# Patient Record
Sex: Female | Born: 1955 | Race: White | Hispanic: No | Marital: Married
Health system: Southern US, Community
[De-identification: ages and names within clinical notes are randomized; demographics above are authoritative.]

## PROBLEM LIST (undated history)

## (undated) DIAGNOSIS — E119 Type 2 diabetes mellitus without complications: Secondary | ICD-10-CM

## (undated) DIAGNOSIS — E079 Disorder of thyroid, unspecified: Secondary | ICD-10-CM

## (undated) HISTORY — PX: TOTAL ABDOMINAL HYSTERECTOMY: SHX209

## (undated) HISTORY — PX: KNEE ARTHROSCOPY: SUR90

## (undated) HISTORY — PX: CHOLECYSTECTOMY: SHX55

## (undated) HISTORY — PX: ABDOMINAL HYSTERECTOMY: SHX81

---

## 2013-12-27 ENCOUNTER — Other Ambulatory Visit: Payer: Self-pay | Admitting: Gastroenterology

## 2013-12-27 DIAGNOSIS — R945 Abnormal results of liver function studies: Principal | ICD-10-CM

## 2013-12-27 DIAGNOSIS — R7989 Other specified abnormal findings of blood chemistry: Secondary | ICD-10-CM

## 2014-01-03 ENCOUNTER — Other Ambulatory Visit: Payer: Self-pay

## 2014-04-08 ENCOUNTER — Other Ambulatory Visit: Payer: Self-pay | Admitting: Gastroenterology

## 2014-04-08 DIAGNOSIS — K7581 Nonalcoholic steatohepatitis (NASH): Secondary | ICD-10-CM

## 2014-04-17 ENCOUNTER — Other Ambulatory Visit: Payer: Self-pay

## 2014-04-18 ENCOUNTER — Ambulatory Visit
Admission: RE | Admit: 2014-04-18 | Discharge: 2014-04-18 | Disposition: A | Discharge status: Home / Self Care | Payer: BC Managed Care – PPO | Source: Ambulatory Visit | Attending: Gastroenterology | Admitting: Gastroenterology

## 2014-04-18 DIAGNOSIS — K7581 Nonalcoholic steatohepatitis (NASH): Secondary | ICD-10-CM

## 2014-04-18 MED ORDER — IOHEXOL 300 MG/ML  SOLN
125.0000 mL | Freq: Once | INTRAMUSCULAR | Status: AC | PRN
  Administered 2014-04-18: 125 mL via INTRAVENOUS

## 2014-06-15 DIAGNOSIS — R7301 Impaired fasting glucose: Secondary | ICD-10-CM | POA: Insufficient documentation

## 2014-06-15 DIAGNOSIS — N921 Excessive and frequent menstruation with irregular cycle: Secondary | ICD-10-CM | POA: Insufficient documentation

## 2014-06-15 DIAGNOSIS — Z9889 Other specified postprocedural states: Secondary | ICD-10-CM | POA: Insufficient documentation

## 2014-06-15 DIAGNOSIS — R7401 Elevation of levels of liver transaminase levels: Secondary | ICD-10-CM | POA: Insufficient documentation

## 2014-06-15 DIAGNOSIS — R7402 Elevation of levels of lactic acid dehydrogenase (LDH): Secondary | ICD-10-CM | POA: Insufficient documentation

## 2014-06-15 DIAGNOSIS — F909 Attention-deficit hyperactivity disorder, unspecified type: Secondary | ICD-10-CM | POA: Insufficient documentation

## 2014-06-15 DIAGNOSIS — IMO0002 Reserved for concepts with insufficient information to code with codable children: Secondary | ICD-10-CM | POA: Insufficient documentation

## 2014-06-15 DIAGNOSIS — L409 Psoriasis, unspecified: Secondary | ICD-10-CM | POA: Insufficient documentation

## 2014-06-15 DIAGNOSIS — Z87410 Personal history of cervical dysplasia: Secondary | ICD-10-CM | POA: Insufficient documentation

## 2015-09-04 DIAGNOSIS — Z90710 Acquired absence of both cervix and uterus: Secondary | ICD-10-CM | POA: Insufficient documentation

## 2016-06-29 DIAGNOSIS — M1712 Unilateral primary osteoarthritis, left knee: Secondary | ICD-10-CM | POA: Insufficient documentation

## 2019-01-01 ENCOUNTER — Telehealth: Payer: Self-pay

## 2019-01-01 NOTE — Telephone Encounter (Signed)
Pt wanted provider to be aware she dropped off her FMLA form on Thursday 12/28/18. Pt requests a call once form is completed. No other inquiries during call.

## 2019-01-01 NOTE — Telephone Encounter (Signed)
Ok will work on this once back in the office tomorrow.

## 2019-05-06 ENCOUNTER — Other Ambulatory Visit: Payer: Self-pay

## 2019-05-06 ENCOUNTER — Emergency Department (HOSPITAL_BASED_OUTPATIENT_CLINIC_OR_DEPARTMENT_OTHER): Payer: BC Managed Care – PPO

## 2019-05-06 ENCOUNTER — Emergency Department (HOSPITAL_BASED_OUTPATIENT_CLINIC_OR_DEPARTMENT_OTHER)
Admission: EM | Admit: 2019-05-06 | Discharge: 2019-05-06 | Disposition: A | Discharge status: Home / Self Care | Payer: BC Managed Care – PPO | Attending: Emergency Medicine | Admitting: Emergency Medicine

## 2019-05-06 ENCOUNTER — Encounter (HOSPITAL_BASED_OUTPATIENT_CLINIC_OR_DEPARTMENT_OTHER): Payer: Self-pay | Admitting: Emergency Medicine

## 2019-05-06 DIAGNOSIS — S42201A Unspecified fracture of upper end of right humerus, initial encounter for closed fracture: Secondary | ICD-10-CM

## 2019-05-06 DIAGNOSIS — Y9389 Activity, other specified: Secondary | ICD-10-CM | POA: Diagnosis not present

## 2019-05-06 DIAGNOSIS — S161XXA Strain of muscle, fascia and tendon at neck level, initial encounter: Secondary | ICD-10-CM | POA: Insufficient documentation

## 2019-05-06 DIAGNOSIS — S0990XA Unspecified injury of head, initial encounter: Secondary | ICD-10-CM

## 2019-05-06 DIAGNOSIS — S2241XA Multiple fractures of ribs, right side, initial encounter for closed fracture: Secondary | ICD-10-CM | POA: Diagnosis not present

## 2019-05-06 DIAGNOSIS — Y9289 Other specified places as the place of occurrence of the external cause: Secondary | ICD-10-CM | POA: Insufficient documentation

## 2019-05-06 DIAGNOSIS — W19XXXA Unspecified fall, initial encounter: Secondary | ICD-10-CM

## 2019-05-06 DIAGNOSIS — Y998 Other external cause status: Secondary | ICD-10-CM | POA: Insufficient documentation

## 2019-05-06 DIAGNOSIS — W11XXXA Fall on and from ladder, initial encounter: Secondary | ICD-10-CM | POA: Diagnosis not present

## 2019-05-06 DIAGNOSIS — S42401A Unspecified fracture of lower end of right humerus, initial encounter for closed fracture: Secondary | ICD-10-CM | POA: Insufficient documentation

## 2019-05-06 HISTORY — DX: Type 2 diabetes mellitus without complications: E11.9

## 2019-05-06 HISTORY — DX: Disorder of thyroid, unspecified: E07.9

## 2019-05-06 MED ORDER — OXYCODONE-ACETAMINOPHEN 5-325 MG PO TABS: 2.0000 | ORAL_TABLET | Freq: Four times a day (QID) | ORAL | 0 refills | Status: DC | PRN

## 2019-05-06 MED ORDER — MORPHINE SULFATE (PF) 4 MG/ML IV SOLN
4.0000 mg | Freq: Once | INTRAVENOUS | Status: AC
  Administered 2019-05-06: 4 mg via INTRAVENOUS
  Filled 2019-05-06: qty 1

## 2019-05-06 MED ORDER — ONDANSETRON HCL 4 MG/2ML IJ SOLN
4.0000 mg | Freq: Once | INTRAMUSCULAR | Status: AC
  Administered 2019-05-06: 18:00:00 4 mg via INTRAVENOUS
  Filled 2019-05-06: qty 2

## 2019-05-06 MED ORDER — OXYCODONE-ACETAMINOPHEN 5-325 MG PO TABS: 1.0000 | ORAL_TABLET | Freq: Four times a day (QID) | ORAL | 0 refills | Status: DC | PRN

## 2019-05-06 NOTE — ED Provider Notes (Signed)
Emergency Department Provider Note   I have reviewed the triage vital signs and the nursing notes.   HISTORY  Chief Complaint Fall and Shoulder Pain   HPI Roberta Hayes is a 63 y.o. female presents to the ED for evaluation of right shoulder and neck pain after falling backwards off a ladder. Patient was getting X-mas decorations down and fell. Question head injury but denies LOC. No numbness/weakness. Mild right elbow pain and severe pain in the right shoulder. No LE pain. No radiation of symptoms.   Past Medical History:  Diagnosis Date   Diabetes mellitus without complication ([place])    Thyroid disease     There are no active problems to display for this patient.   Allergies Patient has no allergy information on record.  No family history on file.  Social History Social History   Tobacco Use   Smoking status: Never Smoker   Smokeless tobacco: Never Used  Substance Use Topics   Alcohol use: Yes    Comment: rarely   Drug use: Never    Review of Systems  Constitutional: No fever/chills Eyes: No visual changes. ENT: No sore throat. Cardiovascular: Denies chest pain. Respiratory: Denies shortness of breath. Gastrointestinal: No abdominal pain.  No nausea, no vomiting.  No diarrhea.  No constipation. Genitourinary: Negative for dysuria. Musculoskeletal: Negative for back pain. Positive right shoulder and neck pain.  Skin: Negative for rash. Neurological: Negative for focal weakness or numbness. Positive HA.   10-point ROS otherwise negative.  ____________________________________________   PHYSICAL EXAM:  VITAL SIGNS: ED Triage Vitals  Enc Vitals Group     BP 05/06/19 1656 125/79     Pulse Rate 05/06/19 1656 91     Resp 05/06/19 1656 18     Temp 05/06/19 1656 98.2 F (36.8 C)     Temp Source 05/06/19 1656 Oral     SpO2 05/06/19 1656 100 %     Weight 05/06/19 1656 210 lb (95.3 kg)     Height 05/06/19 1656 5\' 5"  (1.651 m)   Constitutional:  Alert and oriented. Well appearing and in no acute distress. Eyes: Conjunctivae are normal. PERRL. Head: Atraumatic. Nose: No congestion/rhinnorhea. Mouth/Throat: Mucous membranes are moist.  Neck: No stridor. No cervical spine tenderness to palpation. Cardiovascular: Normal rate, regular rhythm. Good peripheral circulation. Grossly normal heart sounds.   Respiratory: Normal respiratory effort.  No retractions. Lungs CTAB. Gastrointestinal: Soft and nontender. No distention.  Musculoskeletal: No lower extremity tenderness nor edema. Severe pain with any movement of the right shoulder. Mild tenderness to the lateral elbow. No tenderness in the wrists.  Neurologic:  Normal speech and language. No gross focal neurologic deficits are appreciated.  Skin:  Skin is warm, dry and intact. No rash noted.  ____________________________________________  RADIOLOGY  Dg Ribs Unilateral W/chest Right  Result Date: 05/06/2019 CLINICAL DATA:  Patient status post fall from a ladder. EXAM: RIGHT RIBS AND CHEST - 3+ VIEW COMPARISON:  None. FINDINGS: Normal cardiac and mediastinal contours. No consolidative pulmonary opacities. No pleural effusion or pneumothorax. Impacted proximal right humerus fracture. Possible nondisplaced right lateral fourth rib fracture. Additionally, there is a nondisplaced lateral right seventh rib fracture. IMPRESSION: Nondisplaced right lateral seventh rib fracture and possible lateral right fourth rib fracture. Clear lungs. Proximal right humerus fracture. Electronically Signed   By: Lovey Newcomer M.D.   On: 05/06/2019 18:35   Dg Shoulder Right  Result Date: 05/06/2019 CLINICAL DATA:  Fall EXAM: RIGHT SHOULDER - 2+ VIEW COMPARISON:  None. FINDINGS:  AC joint is intact. Acute mildly impacted fracture involving the right humeral neck with extension of fracture lucency to the greater tuberosity of the humerus. No humeral head dislocation IMPRESSION: Acute mildly comminuted and impacted  fracture involving the right humeral neck with fracture lucency also visible at the greater tuberosity Electronically Signed   By: Jasmine Pang M.D.   On: 05/06/2019 18:33   Dg Elbow 2 Views Right  Result Date: 05/06/2019 CLINICAL DATA:  Fall EXAM: RIGHT ELBOW - 2 VIEW COMPARISON:  None. FINDINGS: Suboptimal lateral view due to positioning and overlying soft tissues. Limited evaluation for elbow effusion. No gross fracture or dislocation IMPRESSION: Limited evaluation for elbow effusion for reasons above. No definite acute osseous abnormality Electronically Signed   By: Jasmine Pang M.D.   On: 05/06/2019 18:33   Ct Head Wo Contrast  Result Date: 05/06/2019 CLINICAL DATA:  Fall EXAM: CT HEAD WITHOUT CONTRAST CT CERVICAL SPINE WITHOUT CONTRAST TECHNIQUE: Multidetector CT imaging of the head and cervical spine was performed following the standard protocol without intravenous contrast. Multiplanar CT image reconstructions of the cervical spine were also generated. COMPARISON:  None. FINDINGS: CT HEAD FINDINGS Brain: No acute territorial infarction, hemorrhage, or intracranial mass. The ventricles are nonenlarged. Vascular: No hyperdense vessels.  Carotid vascular calcification Skull: Normal. Negative for fracture or focal lesion. Sinuses/Orbits: No acute finding. Other: None CT CERVICAL SPINE FINDINGS Alignment: Straightening of the cervical spine. No subluxation. Facet alignment within normal limits Skull base and vertebrae: No acute fracture. No primary bone lesion or focal pathologic process. Soft tissues and spinal canal: No prevertebral fluid or swelling. No visible canal hematoma. Disc levels: Mild disc space narrowing at C4-C5, moderate-to-marked disc space narrowing at C5-C6 and C6-C7. Bilateral foraminal narrowing at C5-C6 and C6-C7. Upper chest: Negative. Other: None IMPRESSION: 1. Negative non contrasted CT appearance of the brain 2. Straightening of the cervical spine with degenerative change. No  acute osseous abnormality Electronically Signed   By: Jasmine Pang M.D.   On: 05/06/2019 18:49   Ct Cervical Spine Wo Contrast  Result Date: 05/06/2019 CLINICAL DATA:  Fall EXAM: CT HEAD WITHOUT CONTRAST CT CERVICAL SPINE WITHOUT CONTRAST TECHNIQUE: Multidetector CT imaging of the head and cervical spine was performed following the standard protocol without intravenous contrast. Multiplanar CT image reconstructions of the cervical spine were also generated. COMPARISON:  None. FINDINGS: CT HEAD FINDINGS Brain: No acute territorial infarction, hemorrhage, or intracranial mass. The ventricles are nonenlarged. Vascular: No hyperdense vessels.  Carotid vascular calcification Skull: Normal. Negative for fracture or focal lesion. Sinuses/Orbits: No acute finding. Other: None CT CERVICAL SPINE FINDINGS Alignment: Straightening of the cervical spine. No subluxation. Facet alignment within normal limits Skull base and vertebrae: No acute fracture. No primary bone lesion or focal pathologic process. Soft tissues and spinal canal: No prevertebral fluid or swelling. No visible canal hematoma. Disc levels: Mild disc space narrowing at C4-C5, moderate-to-marked disc space narrowing at C5-C6 and C6-C7. Bilateral foraminal narrowing at C5-C6 and C6-C7. Upper chest: Negative. Other: None IMPRESSION: 1. Negative non contrasted CT appearance of the brain 2. Straightening of the cervical spine with degenerative change. No acute osseous abnormality Electronically Signed   By: Jasmine Pang M.D.   On: 05/06/2019 18:49    ____________________________________________   PROCEDURES  Procedure(s) performed:   Procedures  None ____________________________________________   INITIAL IMPRESSION / ASSESSMENT AND PLAN / ED COURSE  Pertinent labs & imaging results that were available during my care of the patient were reviewed by me  and considered in my medical decision making (see chart for details).   Patient presents to  the ED after fall. CT imaging and plain films reviewed. Prox humerus fx and rib fx. Sling and pain medication provided. [place] database reviewed. Patient referred to ortho and incentive spirometer provided. Discussed ED return precautions.    ____________________________________________  FINAL CLINICAL IMPRESSION(S) / ED DIAGNOSES  Final diagnoses:  Fall, initial encounter  Injury of head, initial encounter  Strain of neck muscle, initial encounter  Closed fracture of proximal end of right humerus, unspecified fracture morphology, initial encounter  Closed fracture of multiple ribs of right side, initial encounter     MEDICATIONS GIVEN DURING THIS VISIT:  Medications  morphine 4 MG/ML injection 4 mg (4 mg Intravenous Given 05/06/19 1737)  ondansetron (ZOFRAN) injection 4 mg (4 mg Intravenous Given 05/06/19 1737)    Note:  This document was prepared using Dragon voice recognition software and may include unintentional dictation errors.  Alona BeneJoshua Alexander Aument, MD Emergency Medicine    Araina Butrick, Arlyss RepressJoshua G, MD 05/08/19 [phone]

## 2019-05-06 NOTE — Discharge Instructions (Signed)
Your workup today showed that you have a fracture to one or more ribs.  Unfortunately this type of injury hurts but there is no way to fix it immediately; it must heal over time.  Be sure to take plenty of deep breaths so that you get rid of the "bad air" in your lungs.  If you are given a device called an incentive spirometer, please use it as recommended.  Unless you have been told by your doctor not to do so, we recommend you take ibuprofen 600 mg 3 times daily with meals for no more than 5 days.  You can also take Tylenol 1000 mg every 6 hours for pain.  You also have a fracture of your left shoulder.  You will need to see the orthopedic surgeon.  Please call tomorrow to schedule a follow-up appointment.  Follow-up at the clinics or with the doctors described in this paperwork.  Return to the emergency department if he develop new or worsening symptoms that concern you.   Rib Fracture A rib fracture is a break or crack in one of the bones of the ribs. The ribs are a group of Roberta Hayes, curved bones that wrap around your chest and attach to your spine. They protect your lungs and other organs in the chest cavity. A broken or cracked rib is often painful, but most do not cause other problems. Most rib fractures heal on their own over time. However, rib fractures can be more serious if multiple ribs are broken or if broken ribs move out of place and push against other structures. CAUSES  A direct blow to the chest. For example, this could happen during contact sports, a car accident, or a fall against a hard object. Repetitive movements with high force, such as pitching a baseball or having severe coughing spells. SYMPTOMS  Pain when you breathe in or cough. Pain when someone presses on the injured area. DIAGNOSIS  Your caregiver will perform a physical exam. Various imaging tests may be ordered to confirm the diagnosis and to look for related injuries. These tests may include a chest X-ray, computed  tomography (CT), magnetic resonance imaging (MRI), or a bone scan. TREATMENT  Rib fractures usually heal on their own in 1-3 months. The longer healing period is often associated with a continued cough or other aggravating activities. During the healing period, pain control is very important. Medication is usually given to control pain. Hospitalization or surgery may be needed for more severe injuries, such as those in which multiple ribs are broken or the ribs have moved out of place.  HOME CARE INSTRUCTIONS  Avoid strenuous activity and any activities or movements that cause pain. Be careful during activities and avoid bumping the injured rib. Gradually increase activity as directed by your caregiver. Only take over-the-counter or prescription medications as directed by your caregiver. Do not take other medications without asking your caregiver first. Apply ice to the injured area for the first 1-2 days after you have been treated or as directed by your caregiver. Applying ice helps to reduce inflammation and pain. Put ice in a plastic bag. Place a towel between your skin and the bag.   Leave the ice on for 15-20 minutes at a time, every 2 hours while you are awake. Perform deep breathing as directed by your caregiver. This will help prevent pneumonia, which is a common complication of a broken rib. Your caregiver may instruct you to: Take deep breaths several times a day. Try to  cough several times a day, holding a pillow against the injured area. Use a device called an incentive spirometer to practice deep breathing several times a day. Drink enough fluids to keep your urine clear or pale yellow. This will help you avoid constipation.   Do not wear a rib belt or binder. These restrict breathing, which can lead to pneumonia.   SEEK IMMEDIATE MEDICAL CARE IF:  You have a fever.   You have difficulty breathing or shortness of breath.   You develop a continual cough, or you cough up thick or  bloody sputum. You feel sick to your stomach (nausea), throw up (vomit), or have abdominal pain.   You have worsening pain not controlled with medications.   MAKE SURE YOU: Understand these instructions. Will watch your condition. Will get help right away if you are not doing well or get worse. Document Released: 08/09/2005 Document Revised: 04/11/2013 Document Reviewed: 10/11/2012 [facility] Patient Information 2015 [place], [place]. This information is not intended to replace advice given to you by your health care provider. Make sure you discuss any questions you have with your health care provider.

## 2019-05-06 NOTE — ED Triage Notes (Signed)
Reports being on second step of step ladder trying to get decorations down.  She missed the pull down for the attic stairs and fell off the ladder.  Pain reported to right shoulder, ribcage and back of the neck.  Hit head on the doorway.  Denies any LOC.

## 2019-05-06 NOTE — ED Notes (Signed)
Patient transported to CT 

## 2019-09-24 ENCOUNTER — Encounter: Payer: Self-pay | Admitting: Family Medicine

## 2019-09-24 ENCOUNTER — Other Ambulatory Visit: Payer: Self-pay

## 2019-09-24 ENCOUNTER — Ambulatory Visit (INDEPENDENT_AMBULATORY_CARE_PROVIDER_SITE_OTHER): Payer: BC Managed Care – PPO | Admitting: Family Medicine

## 2019-09-24 VITALS — BP 137/86 | HR 76 | Wt 218.0 lb

## 2019-09-24 DIAGNOSIS — R252 Cramp and spasm: Secondary | ICD-10-CM

## 2019-09-24 DIAGNOSIS — Z79899 Other long term (current) drug therapy: Secondary | ICD-10-CM | POA: Diagnosis not present

## 2019-09-24 DIAGNOSIS — E039 Hypothyroidism, unspecified: Secondary | ICD-10-CM

## 2019-09-24 DIAGNOSIS — Z23 Encounter for immunization: Secondary | ICD-10-CM | POA: Diagnosis not present

## 2019-09-24 DIAGNOSIS — E119 Type 2 diabetes mellitus without complications: Secondary | ICD-10-CM

## 2019-09-24 DIAGNOSIS — E559 Vitamin D deficiency, unspecified: Secondary | ICD-10-CM

## 2019-09-24 NOTE — Patient Instructions (Addendum)
It was great to meet you today! We'll be in touch with lab results!  I'll let you know recommended follow up based on these results.  You can sign up for mychart and view results through there as well.     Diabetes Mellitus and Nutrition, Adult When you have diabetes (diabetes mellitus), it is very important to have healthy eating habits because your blood sugar (glucose) levels are greatly affected by what you eat and drink. Eating healthy foods in the appropriate amounts, at about the same times every day, can help you:  Control your blood glucose.  Lower your risk of heart disease.  Improve your blood pressure.  Reach or maintain a healthy weight. Every person with diabetes is different, and each person has different needs for a meal plan. Your health care provider may recommend that you work with a diet and nutrition specialist (dietitian) to make a meal plan that is best for you. Your meal plan may vary depending on factors such as:  The calories you need.  The medicines you take.  Your weight.  Your blood glucose, blood pressure, and cholesterol levels.  Your activity level.  Other health conditions you have, such as heart or kidney disease. How do carbohydrates affect me? Carbohydrates, also called carbs, affect your blood glucose level more than any other type of food. Eating carbs naturally raises the amount of glucose in your blood. Carb counting is a method for keeping track of how many carbs you eat. Counting carbs is important to keep your blood glucose at a healthy level, especially if you use insulin or take certain oral diabetes medicines. It is important to know how many carbs you can safely have in each meal. This is different for every person. Your dietitian can help you calculate how many carbs you should have at each meal and for each snack. Foods that contain carbs include:  Bread, cereal, rice, pasta, and crackers.  Potatoes and corn.  Peas, beans, and  lentils.  Milk and yogurt.  Fruit and juice.  Desserts, such as cakes, cookies, ice cream, and candy. How does alcohol affect me? Alcohol can cause a sudden decrease in blood glucose (hypoglycemia), especially if you use insulin or take certain oral diabetes medicines. Hypoglycemia can be a life-threatening condition. Symptoms of hypoglycemia (sleepiness, dizziness, and confusion) are similar to symptoms of having too much alcohol. If your health care provider says that alcohol is safe for you, follow these guidelines:  Limit alcohol intake to no more than 1 drink per day for nonpregnant women and 2 drinks per day for men. One drink equals 12 oz of beer, 5 oz of wine, or 1 oz of hard liquor.  Do not drink on an empty stomach.  Keep yourself hydrated with water, diet soda, or unsweetened iced tea.  Keep in mind that regular soda, juice, and other mixers may contain a lot of sugar and must be counted as carbs. What are tips for following this plan?  Reading food labels  Start by checking the serving size on the "Nutrition Facts" label of packaged foods and drinks. The amount of calories, carbs, fats, and other nutrients listed on the label is based on one serving of the item. Many items contain more than one serving per package.  Check the total grams (g) of carbs in one serving. You can calculate the number of servings of carbs in one serving by dividing the total carbs by 15. For example, if a food has 30  g of total carbs, it would be equal to 2 servings of carbs.  Check the number of grams (g) of saturated and trans fats in one serving. Choose foods that have low or no amount of these fats.  Check the number of milligrams (mg) of salt (sodium) in one serving. Most people should limit total sodium intake to less than 2,300 mg per day.  Always check the nutrition information of foods labeled as "low-fat" or "nonfat". These foods may be higher in added sugar or refined carbs and should  be avoided.  Talk to your dietitian to identify your daily goals for nutrients listed on the label. Shopping  Avoid buying canned, premade, or processed foods. These foods tend to be high in fat, sodium, and added sugar.  Shop around the outside edge of the grocery store. This includes fresh fruits and vegetables, bulk grains, fresh meats, and fresh dairy. Cooking  Use low-heat cooking methods, such as baking, instead of high-heat cooking methods like deep frying.  Cook using healthy oils, such as olive, canola, or sunflower oil.  Avoid cooking with butter, cream, or high-fat meats. Meal planning  Eat meals and snacks regularly, preferably at the same times every day. Avoid going long periods of time without eating.  Eat foods high in fiber, such as fresh fruits, vegetables, beans, and whole grains. Talk to your dietitian about how many servings of carbs you can eat at each meal.  Eat 4-6 ounces (oz) of lean protein each day, such as lean meat, chicken, fish, eggs, or tofu. One oz of lean protein is equal to: ? 1 oz of meat, chicken, or fish. ? 1 egg. ?  cup of tofu.  Eat some foods each day that contain healthy fats, such as avocado, nuts, seeds, and fish. Lifestyle  Check your blood glucose regularly.  Exercise regularly as told by your health care provider. This may include: ? 150 minutes of moderate-intensity or vigorous-intensity exercise each week. This could be brisk walking, biking, or water aerobics. ? Stretching and doing strength exercises, such as yoga or weightlifting, at least 2 times a week.  Take medicines as told by your health care provider.  Do not use any products that contain nicotine or tobacco, such as cigarettes and e-cigarettes. If you need help quitting, ask your health care provider.  Work with a Veterinary surgeon or diabetes educator to identify strategies to manage stress and any emotional and social challenges. Questions to ask a health care  provider  Do I need to meet with a diabetes educator?  Do I need to meet with a dietitian?  What number can I call if I have questions?  When are the best times to check my blood glucose? Where to find more information:  American Diabetes Association: diabetes.org  Academy of Nutrition and Dietetics: www.eatright.[employer] of Diabetes and Digestive and Kidney Diseases (NIH): CarFlippers.tn Summary  A healthy meal plan will help you control your blood glucose and maintain a healthy lifestyle.  Working with a diet and nutrition specialist (dietitian) can help you make a meal plan that is best for you.  Keep in mind that carbohydrates (carbs) and alcohol have immediate effects on your blood glucose levels. It is important to count carbs and to use alcohol carefully. This information is not intended to replace advice given to you by your health care provider. Make sure you discuss any questions you have with your health care provider. Document Revised: 07/22/2017 Document Reviewed: 09/13/2016 Elsevier Patient  Education  [employer].

## 2019-09-25 LAB — COMPLETE METABOLIC PANEL WITH GFR
AG Ratio: 1.4 (calc) (ref 1.0–2.5)
ALT: 56 U/L — ABNORMAL HIGH (ref 6–29)
AST: 73 U/L — ABNORMAL HIGH (ref 10–35)
Albumin: 4.2 g/dL (ref 3.6–5.1)
Alkaline phosphatase (APISO): 95 U/L (ref 37–153)
BUN: 9 mg/dL (ref 7–25)
CO2: 27 mmol/L (ref 20–32)
Calcium: 10.1 mg/dL (ref 8.6–10.4)
Chloride: 99 mmol/L (ref 98–110)
Creat: 0.64 mg/dL (ref 0.50–0.99)
GFR, Est African American: 112 mL/min/{1.73_m2} (ref >=60)
GFR, Est Non African American: 96 mL/min/{1.73_m2} (ref >=60)
Globulin: 3 g/dL (calc) (ref 1.9–3.7)
Glucose, Bld: 375 mg/dL — ABNORMAL HIGH (ref 65–99)
Potassium: 4.2 mmol/L (ref 3.5–5.3)
Sodium: 134 mmol/L — ABNORMAL LOW (ref 135–146)
Total Bilirubin: 0.4 mg/dL (ref 0.2–1.2)
Total Protein: 7.2 g/dL (ref 6.1–8.1)

## 2019-09-25 LAB — MICROALBUMIN / CREATININE URINE RATIO
Creatinine, Urine: 27 mg/dL (ref 20–275)
Microalb, Ur: <0.2 mg/dL

## 2019-09-25 LAB — LIPID PANEL
Cholesterol: 208 mg/dL — ABNORMAL HIGH (ref <200)
HDL: 51 mg/dL (ref >=50)
LDL Cholesterol (Calc): 120 mg/dL (calc) — ABNORMAL HIGH
Non-HDL Cholesterol (Calc): 157 mg/dL (calc) — ABNORMAL HIGH (ref <130)
Total CHOL/HDL Ratio: 4.1 (calc) (ref <5.0)
Triglycerides: 244 mg/dL — ABNORMAL HIGH (ref <150)

## 2019-09-25 LAB — MAGNESIUM: Magnesium: 1.6 mg/dL (ref 1.5–2.5)

## 2019-09-25 LAB — VITAMIN B12: Vitamin B-12: 416 pg/mL (ref 200–1100)

## 2019-09-25 LAB — HEMOGLOBIN A1C
Hgb A1c MFr Bld: 10.1 % of total Hgb — ABNORMAL HIGH (ref <5.7)
Mean Plasma Glucose: 243 (calc)
eAG (mmol/L): 13.5 (calc)

## 2019-09-25 LAB — TSH: TSH: 31.29 mIU/L — ABNORMAL HIGH (ref 0.40–4.50)

## 2019-09-25 LAB — VITAMIN D 25 HYDROXY (VIT D DEFICIENCY, FRACTURES): Vit D, 25-Hydroxy: 20 ng/mL — ABNORMAL LOW (ref 30–100)

## 2019-09-26 DIAGNOSIS — E119 Type 2 diabetes mellitus without complications: Secondary | ICD-10-CM | POA: Insufficient documentation

## 2019-09-26 DIAGNOSIS — E559 Vitamin D deficiency, unspecified: Secondary | ICD-10-CM | POA: Insufficient documentation

## 2019-09-26 DIAGNOSIS — R252 Cramp and spasm: Secondary | ICD-10-CM | POA: Insufficient documentation

## 2019-09-26 DIAGNOSIS — E039 Hypothyroidism, unspecified: Secondary | ICD-10-CM | POA: Insufficient documentation

## 2019-09-26 NOTE — Assessment & Plan Note (Signed)
Possibly related to diabetes.  Will update Mg levels as well.

## 2019-09-26 NOTE — Assessment & Plan Note (Signed)
Update a1c today Recommend low carb diet with regular exercise.

## 2019-09-26 NOTE — Assessment & Plan Note (Signed)
Update TSH

## 2019-09-26 NOTE — Progress Notes (Signed)
Roberta Hayes - 64 y.o. female MRN [id number]  Date of birth: [birth year]  Subjective Chief Complaint  Patient presents with  . Establish Care    HPI Roberta Hayes is a 64 y.o. female with history of T2DM, hypothyroidism, HLD and psoriasis here today for initial visit.    -T2DM:  Current tx with metformin.  Reports some increased cramping in her legs.  She denies increased thirst or urination.  She has had some improvement of her cramping with Mg supplement.  She is not exercising regularly but plans to start increasing activity and making changes to her diet.    -Hypothyroidism:  Current rx of levothroxine .  She reports that she is taking this as directed.  She denies symptoms of hypo/hyper-thyroidism.    -HLD:  Current tx with pitavastatin, tolerating well.  She denies side effects including myalgias.    -Psoriasis:  Has several steroid creams/solutions for different areas of the body.  Currently well controlled with current topicals.  Denies associated joint pain.   ROS:  A comprehensive ROS was completed and negative except as noted per HPI   No Known Allergies  Past Medical History:  Diagnosis Date  . Diabetes mellitus without complication (HCC)   . Thyroid disease     Past Surgical History:  Procedure Laterality Date  . ABDOMINAL HYSTERECTOMY    . CHOLECYSTECTOMY    . KNEE ARTHROSCOPY Left     Social History   Socioeconomic History  . Marital status: Married    Spouse name: Not on file  . Number of children: Not on file  . Years of education: Not on file  . Highest education level: Not on file  Occupational History  . Not on file  Tobacco Use  . Smoking status: Never Smoker  . Smokeless tobacco: Never Used  Substance and Sexual Activity  . Alcohol use: Yes    Comment: rarely  . Drug use: Never  . Sexual activity: Not on file  Other Topics Concern  . Not on file  Social History Narrative  . Not on file   Social Determinants of Health   Financial  Resource Strain:   . Difficulty of Paying Living Expenses: Not on file  Food Insecurity:   . Worried About Programme researcher, broadcasting/film/video in the Last Year: Not on file  . Ran Out of Food in the Last Year: Not on file  Transportation Needs:   . Lack of Transportation (Medical): Not on file  . Lack of Transportation (Non-Medical): Not on file  Physical Activity:   . Days of Exercise per Week: Not on file  . Minutes of Exercise per Session: Not on file  Stress:   . Feeling of Stress : Not on file  Social Connections:   . Frequency of Communication with Friends and Family: Not on file  . Frequency of Social Gatherings with Friends and Family: Not on file  . Attends Religious Services: Not on file  . Active Member of Clubs or Organizations: Not on file  . Attends Banker Meetings: Not on file  . Marital Status: Not on file    History reviewed. No pertinent family history.  Health Maintenance  Topic Date Due  . Hepatitis C Screening  09/21/1957  . HIV Screening  08/01/1973  . TETANUS/TDAP  08/01/1977  . PAP SMEAR-Modifier  08/02/1979  . MAMMOGRAM  08/01/2008  . COLONOSCOPY  08/01/2008  . URINE MICROALBUMIN  09/23/2020  . INFLUENZA VACCINE  Completed    -----------------------------------------------------------------------------------------------------------------------------------------------------------------------------------------------------------------  Physical Exam BP 137/86   Pulse 76   Wt 218 lb (98.9 kg)   BMI 36.28 kg/m   Physical Exam Constitutional:      Appearance: Normal appearance.  HENT:     Head: Normocephalic and atraumatic.     Mouth/Throat:     Mouth: Mucous membranes are moist.  Eyes:     General: No scleral icterus. Cardiovascular:     Rate and Rhythm: Normal rate and regular rhythm.  Pulmonary:     Effort: Pulmonary effort is normal.     Breath sounds: Normal breath sounds.  Musculoskeletal:     Cervical back: Neck supple.  Skin:     General: Skin is warm and dry.  Neurological:     General: No focal deficit present.     Mental Status: She is alert.  Psychiatric:        Mood and Affect: Mood normal.        Behavior: Behavior normal.     ------------------------------------------------------------------------------------------------------------------------------------------------------------------------------------------------------------------- Assessment and Plan  Type 2 diabetes mellitus without complication, without long-term current use of insulin ([place]) Update a1c today Recommend low carb diet with regular exercise.    Acquired hypothyroidism Update TSH.   Leg cramping Possibly related to diabetes.  Will update Mg levels as well.   Vitamin D deficiency Update 25-OH vitamin d today.     This visit occurred during the SARS-CoV-2 public health emergency.  Safety protocols were in place, including screening questions prior to the visit, additional usage of staff PPE, and extensive cleaning of exam room while observing appropriate contact time as indicated for disinfecting solutions.

## 2019-09-26 NOTE — Assessment & Plan Note (Signed)
Update 25-OH vitamin d today.

## 2019-09-27 ENCOUNTER — Ambulatory Visit (INDEPENDENT_AMBULATORY_CARE_PROVIDER_SITE_OTHER): Payer: BC Managed Care – PPO | Admitting: Family Medicine

## 2019-09-27 ENCOUNTER — Encounter: Payer: Self-pay | Admitting: Family Medicine

## 2019-09-27 DIAGNOSIS — E785 Hyperlipidemia, unspecified: Secondary | ICD-10-CM

## 2019-09-27 DIAGNOSIS — E039 Hypothyroidism, unspecified: Secondary | ICD-10-CM

## 2019-09-27 DIAGNOSIS — E119 Type 2 diabetes mellitus without complications: Secondary | ICD-10-CM

## 2019-09-27 MED ORDER — BETAMETHASONE DIPROPIONATE AUG 0.05 % EX CREA: TOPICAL_CREAM | Freq: Two times a day (BID) | CUTANEOUS | 1 refills | Status: DC

## 2019-09-27 MED ORDER — LEVOTHYROXINE SODIUM 88 MCG PO TABS: 88.0000 ug | ORAL_TABLET | Freq: Every day | ORAL | 1 refills | Status: DC

## 2019-09-27 MED ORDER — FLUOCINONIDE 0.05 % EX SOLN: 1.0000 "application " | Freq: Two times a day (BID) | CUTANEOUS | 2 refills | Status: DC

## 2019-09-27 MED ORDER — TRULICITY 0.75 MG/0.5ML ~~LOC~~ SOAJ: 0.7500 mg | SUBCUTANEOUS | 1 refills | Status: DC

## 2019-09-27 MED ORDER — METFORMIN HCL 1000 MG PO TABS: 1000.0000 mg | ORAL_TABLET | Freq: Two times a day (BID) | ORAL | 2 refills | Status: DC

## 2019-09-27 MED ORDER — LIVALO 2 MG PO TABS: 2.0000 mg | ORAL_TABLET | Freq: Every day | ORAL | 2 refills | Status: DC

## 2019-09-27 MED ORDER — ACYCLOVIR 5 % EX OINT: 1.0000 "application " | TOPICAL_OINTMENT | CUTANEOUS | 1 refills | Status: DC | PRN

## 2019-09-27 NOTE — Assessment & Plan Note (Signed)
Most recent A1c of  Lab Results  Component Value Date   HGBA1C 10.1 (H) 09/24/2019   indicates diabetes is not well controlled.  She will restart metformin with addition of Trulicity.  Counseled on healthy, low carb diet and recommend frequent activity to help with maintaining good control of blood sugars.

## 2019-09-27 NOTE — Progress Notes (Addendum)
Roberta Hayes - 64 y.o. female MRN [id number]  Date of birth: 02-24-1956   This visit type was conducted due to national recommendations for restrictions regarding the COVID-19 Pandemic (e.g. social distancing).  This format is felt to be most appropriate for this patient at this time.  All issues noted in this document were discussed and addressed.  No physical exam was performed (except for noted visual exam findings with Video Visits).  I discussed the limitations of evaluation and management by telemedicine and the availability of in person appointments. The patient expressed understanding and agreed to proceed.  I connected with@ on 09/27/19 at 11:30 AM EST by a video enabled telemedicine application and verified that I am speaking with the correct person using two identifiers.  Present at visit: Everrett Coombe, DO Devonne Doughty   Patient Location: Home [address] [place] [address]   Provider location:   Primary Care [facility]  Chief Complaint  Patient presents with  . Labs Only    discuss lab results    HPI  Roberta Hayes is a 64 y.o. female who presents via audio/video conferencing for a telehealth visit today.  She is following up today for recent labs.   Discussed with her that diabetes is not well controlled at this time with recent a1c at 10.1.  Currently out of metformin.   She is working on making lifestyle changes starting with changing diet.    TSH is elevated as well.  She is out of levothyroxine.  Reports that she does feel a little sluggish.  Cholesterol is elevated however she has been out of livalo.  She has been intolerant to other statins including lipitor and crestor.     ROS:  A comprehensive ROS was completed and negative except as noted per HPI  Past Medical History:  Diagnosis Date  . Diabetes mellitus without complication (HCC)   . Thyroid disease     Past Surgical History:  Procedure Laterality Date  . ABDOMINAL HYSTERECTOMY    .  CHOLECYSTECTOMY    . KNEE ARTHROSCOPY Left     No family history on file.  Social History   Socioeconomic History  . Marital status: Married    Spouse name: Not on file  . Number of children: Not on file  . Years of education: Not on file  . Highest education level: Not on file  Occupational History  . Not on file  Tobacco Use  . Smoking status: Never Smoker  . Smokeless tobacco: Never Used  Substance and Sexual Activity  . Alcohol use: Yes    Comment: rarely  . Drug use: Never  . Sexual activity: Not on file  Other Topics Concern  . Not on file  Social History Narrative  . Not on file   Social Determinants of Health   Financial Resource Strain:   . Difficulty of Paying Living Expenses: Not on file  Food Insecurity:   . Worried About Programme researcher, broadcasting/film/video in the Last Year: Not on file  . Ran Out of Food in the Last Year: Not on file  Transportation Needs:   . Lack of Transportation (Medical): Not on file  . Lack of Transportation (Non-Medical): Not on file  Physical Activity:   . Days of Exercise per Week: Not on file  . Minutes of Exercise per Session: Not on file  Stress:   . Feeling of Stress : Not on file  Social Connections:   . Frequency of Communication with Friends  and Family: Not on file  . Frequency of Social Gatherings with Friends and Family: Not on file  . Attends Religious Services: Not on file  . Active Member of Clubs or Organizations: Not on file  . Attends Archivist Meetings: Not on file  . Marital Status: Not on file  Intimate Partner Violence:   . Fear of Current or Ex-Partner: Not on file  . Emotionally Abused: Not on file  . Physically Abused: Not on file  . Sexually Abused: Not on file     Current Outpatient Medications:  .  acyclovir ointment (ZOVIRAX) 5 %, Apply 1 application topically as needed., Disp: 30 g, Rfl: 1 .  aspirin 325 MG tablet, Take 325 mg by mouth daily., Disp: , Rfl:  .  augmented betamethasone  dipropionate (DIPROLENE-AF) 0.05 % cream, Apply topically 2 (two) times daily., Disp: 30 g, Rfl: 1 .  Betamethasone Valerate 0.12 % foam, Apply topically., Disp: , Rfl:  .  fluocinonide (LIDEX) 0.05 % external solution, Apply 1 application topically 2 (two) times daily., Disp: 60 mL, Rfl: 2 .  ibuprofen (ADVIL) 400 MG tablet, Take 400 mg by mouth every 6 (six) hours as needed., Disp: , Rfl:  .  levothyroxine (SYNTHROID) 88 MCG tablet, Take 1 tablet (88 mcg total) by mouth daily before breakfast., Disp: 90 tablet, Rfl: 1 .  Magnesium 250 MG TABS, Take 250 mg by mouth daily., Disp: , Rfl:  .  metFORMIN (GLUCOPHAGE) 1000 MG tablet, Take 1 tablet (1,000 mg total) by mouth 2 (two) times daily with a meal., Disp: 180 tablet, Rfl: 2 .  Pitavastatin Calcium (LIVALO) 2 MG TABS, Take 1 tablet (2 mg total) by mouth daily., Disp: 90 tablet, Rfl: 2 .  Turmeric (QC TUMERIC COMPLEX PO), Take 550 mg by mouth. Tumeric and ginger, Disp: , Rfl:  .  Dulaglutide (TRULICITY) 0.62 IR/4.8NI SOPN, Inject 0.75 mg into the skin once a week., Disp: 12 pen, Rfl: 1  EXAM:  VITALS per patient if applicable: Wt 218 lb (98.9 kg)   BMI 36.28 kg/m   GENERAL: alert, oriented, appears well and in no acute distress  HEENT: atraumatic, conjunttiva clear, no obvious abnormalities on inspection of external nose and ears  NECK: normal movements of the head and neck  LUNGS: on inspection no signs of respiratory distress, breathing rate appears normal, no obvious gross SOB, gasping or wheezing  CV: no obvious cyanosis  MS: moves all visible extremities without noticeable abnormality  PSYCH/NEURO: pleasant and cooperative, no obvious depression or anxiety, speech and thought processing grossly intact  ASSESSMENT AND PLAN:  Discussed the following assessment and plan:  Type 2 diabetes mellitus without complication, without long-term current use of insulin (HCC) Most recent A1c of  Lab Results  Component Value Date   HGBA1C  10.1 (H) 09/24/2019   indicates diabetes is not well controlled.  She will restart metformin with addition of Trulicity.  Counseled on healthy, low carb diet and recommend frequent activity to help with maintaining good control of blood sugars.    Acquired hypothyroidism Restart levothyroxine at 54mcg daily.  Instructed to take first thing separate from other medications/food.   Update TSH at f/u in 3 months.   HLD (hyperlipidemia) Restart livalo.  Update lipid panel in 6 months.   >20 minutes spent including pre visit preparation, review of prior notes and labs, encounter with patient via video visit and same day documentation.   I discussed the assessment and treatment plan with the patient.  The patient was provided an opportunity to ask questions and all were answered. The patient agreed with the plan and demonstrated an understanding of the instructions.   The patient was advised to call back or seek an in-person evaluation if the symptoms worsen or if the condition fails to improve as anticipated.    Everrett Coombe, DO

## 2019-09-27 NOTE — Assessment & Plan Note (Signed)
Restart levothyroxine at daily.  Instructed to take first thing separate from other medications/food.   Update TSH at f/u in 3 months.

## 2019-09-27 NOTE — Assessment & Plan Note (Signed)
Restart livalo.  Update lipid panel in 6 months.

## 2019-12-13 ENCOUNTER — Other Ambulatory Visit: Payer: Self-pay

## 2019-12-13 MED ORDER — BETAMETHASONE VALERATE 0.12 % EX FOAM: 2.0000 | Freq: Two times a day (BID) | CUTANEOUS | 2 refills | Status: DC | PRN

## 2019-12-13 NOTE — Telephone Encounter (Signed)
Roberta Hayes is requesting a refill on betamethasone valerate foam. Historical provider.

## 2020-03-13 ENCOUNTER — Other Ambulatory Visit: Payer: Self-pay | Admitting: Family Medicine

## 2020-03-19 ENCOUNTER — Other Ambulatory Visit: Payer: Self-pay | Admitting: Family Medicine

## 2020-03-27 ENCOUNTER — Ambulatory Visit: Payer: BC Managed Care – PPO

## 2020-04-01 ENCOUNTER — Ambulatory Visit: Payer: BC Managed Care – PPO

## 2020-04-08 ENCOUNTER — Other Ambulatory Visit: Payer: Self-pay | Admitting: Family Medicine

## 2020-04-18 LAB — HM COLONOSCOPY

## 2020-04-23 ENCOUNTER — Encounter: Payer: BC Managed Care – PPO | Admitting: Family Medicine

## 2020-05-09 IMAGING — CT CT HEAD W/O CM
3 of 8 series · 14 of 47 positions shown, 17 images · non-contrast
Comparison: None.

CLINICAL DATA: Fall

EXAM:
CT HEAD WITHOUT CONTRAST
CT CERVICAL SPINE WITHOUT CONTRAST
TECHNIQUE: Multidetector CT imaging of the head and cervical spine was
performed following the standard protocol without intravenous
contrast. Multiplanar CT image reconstructions of the cervical spine
were also generated.

[Series 4: head 3.0 mpr cor · coronal · 0.32mm/px · 3 of 72 slices shown]
[im 11/72  brain]
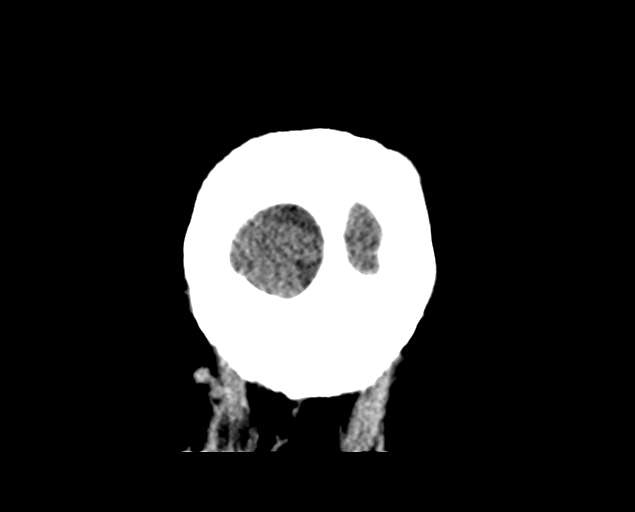
[im 17/72  brain]
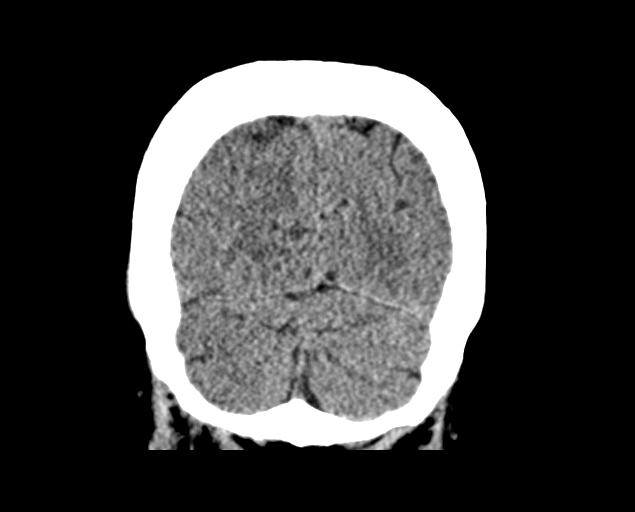
[im 22/72  brain]
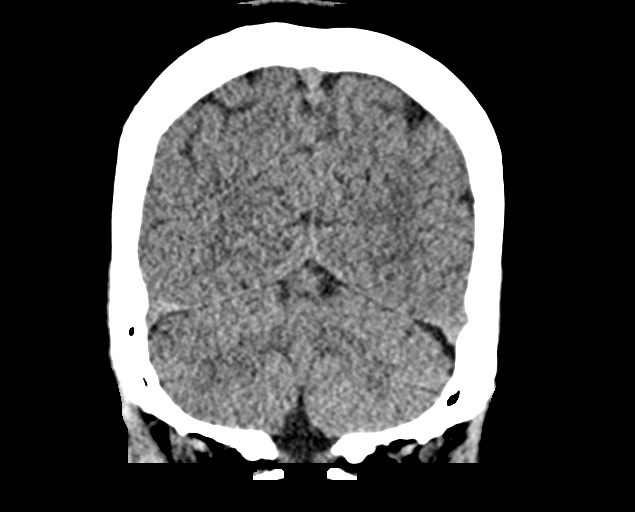

[Series 5: head 3.0 mpr sag · sagittal · 0.32mm/px · 2 of 68 slices shown]
[im 23/68  brain]
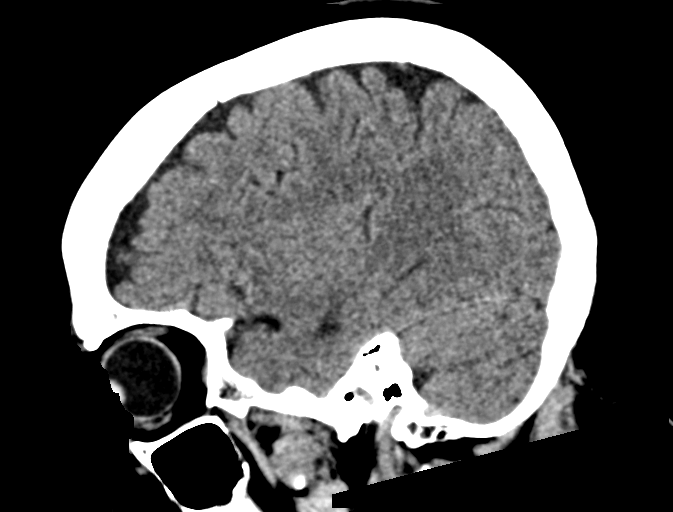
[im 45/68  brain]
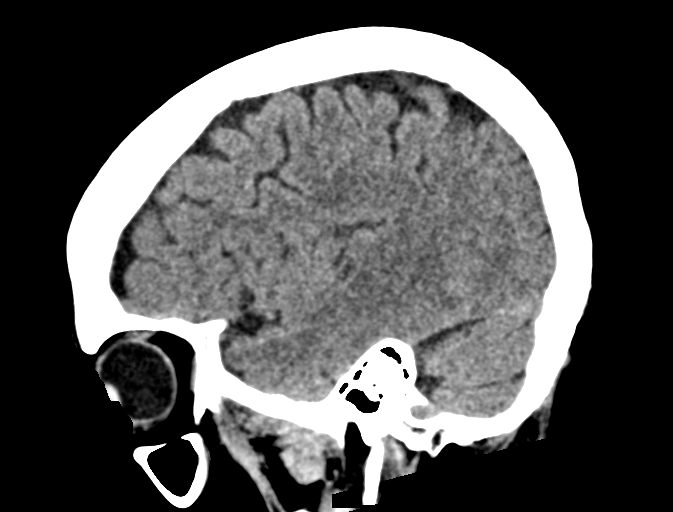

[Series 11: orthogonals · axial · 0.21mm/px · z∈[-348,-162]mm · 9 of 128 slices shown, 12 images]
[im 13/128  brain]
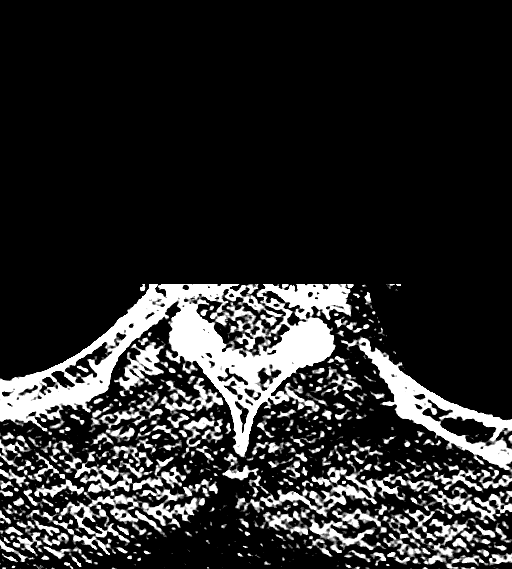
[im 13/128  bone]
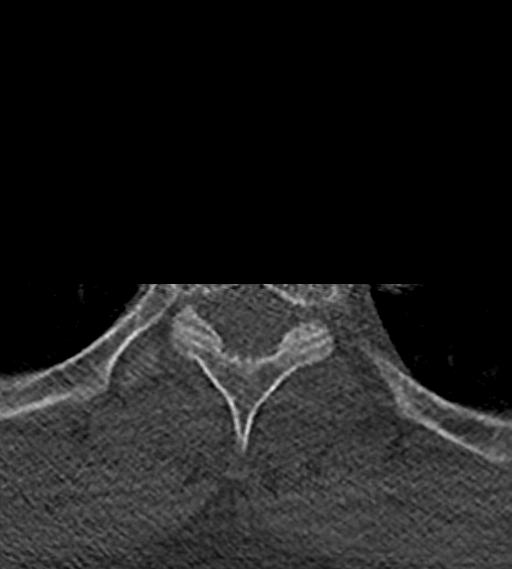
[im 26/128  brain]
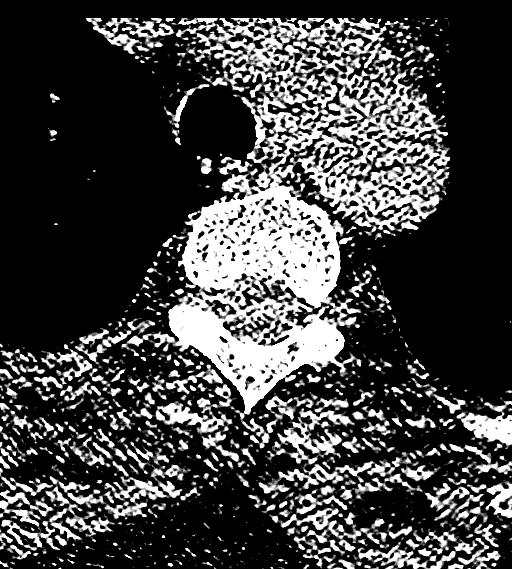
[im 39/128  brain]
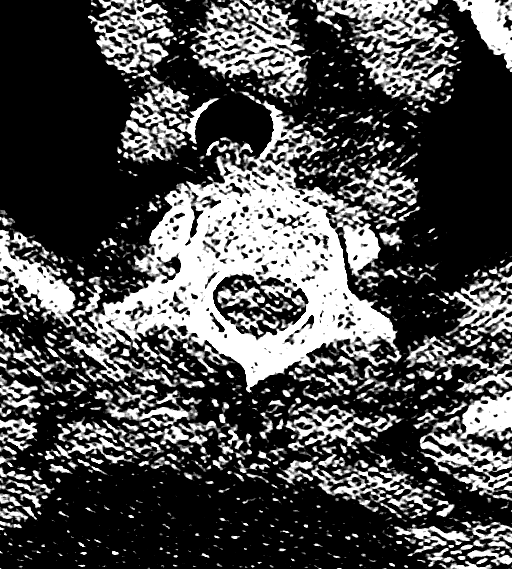
[im 51/128  brain]
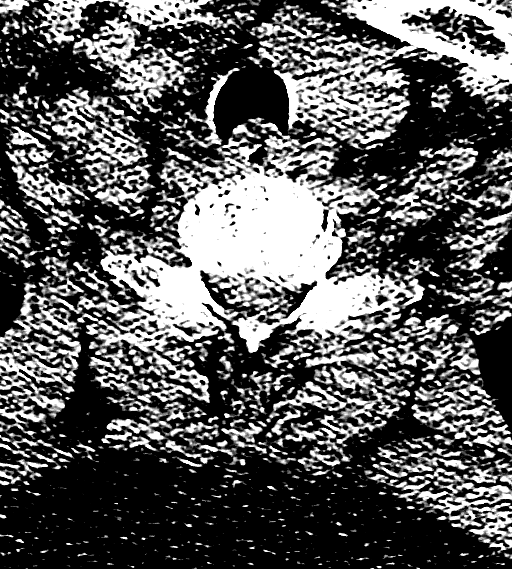
[im 64/128  brain]
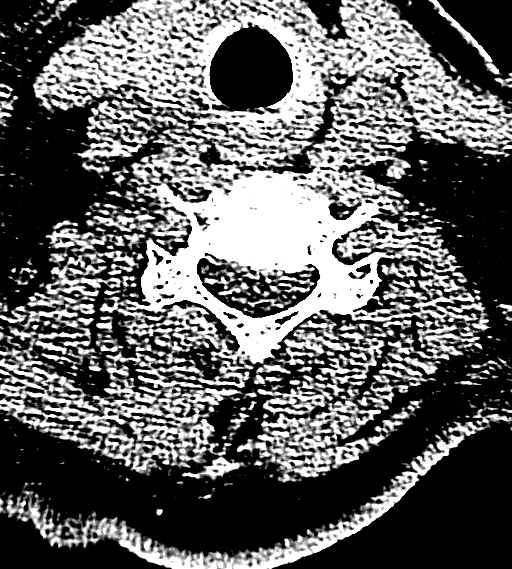
[im 64/128  bone]
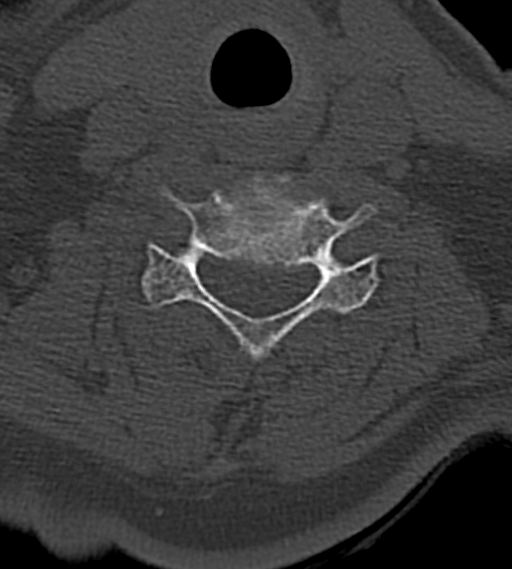
[im 77/128  brain]
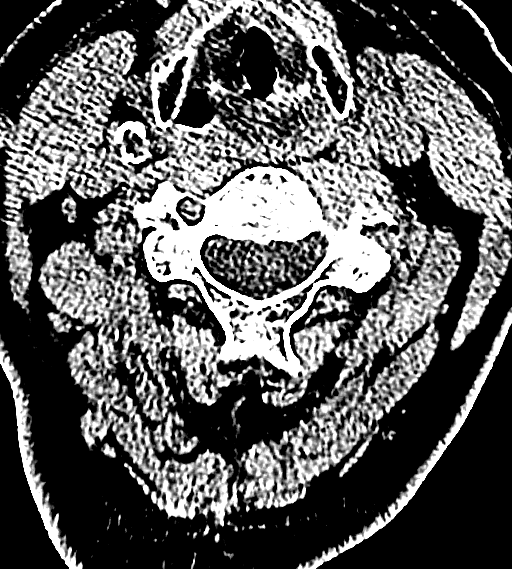
[im 89/128  brain]
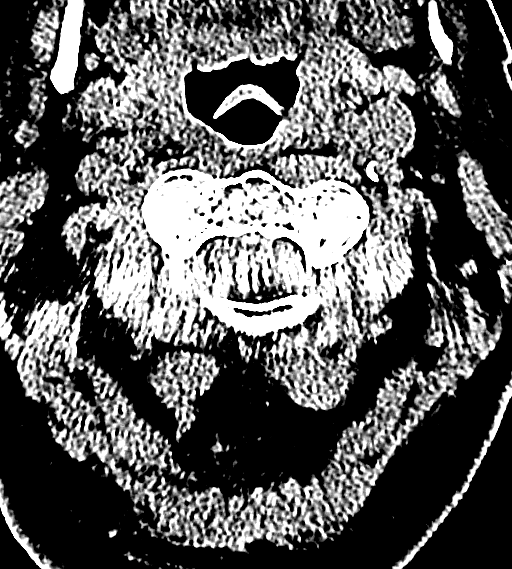
[im 102/128  brain]
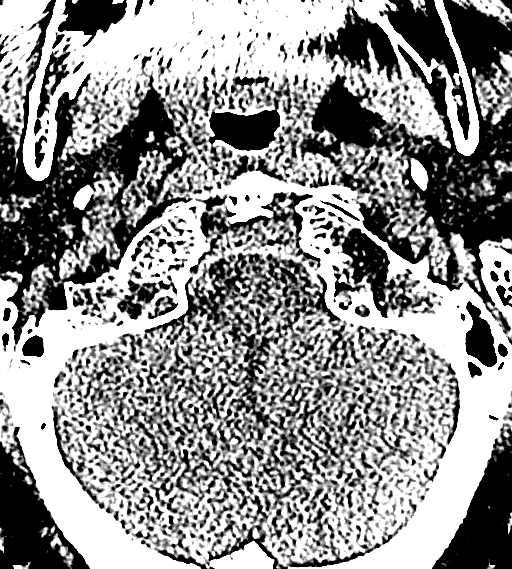
[im 115/128  brain]
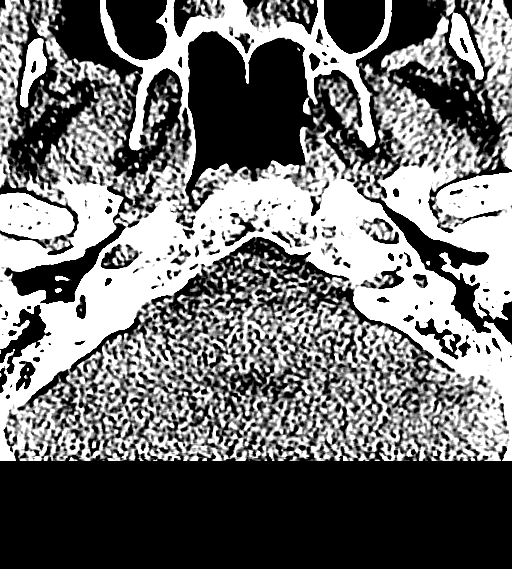
[im 115/128  bone]
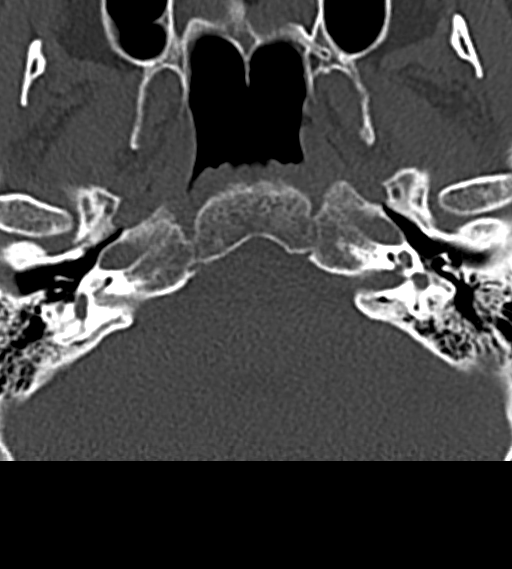

[14 of 47 positions shown; findings below may reference images not displayed]

FINDINGS: CT HEAD FINDINGS

Brain: No acute territorial infarction, hemorrhage, or intracranial
mass. The ventricles are nonenlarged.

Vascular: No hyperdense vessels.  Carotid vascular calcification

Skull: Normal. Negative for fracture or focal lesion.

Sinuses/Orbits: No acute finding.

Other: None

CT CERVICAL SPINE FINDINGS

Alignment: Straightening of the cervical spine. No subluxation.
Facet alignment within normal limits

Skull base and vertebrae: No acute fracture. No primary bone lesion
or focal pathologic process.

Soft tissues and spinal canal: No prevertebral fluid or swelling. No
visible canal hematoma.

Disc levels: Mild disc space narrowing at C4-C5, moderate-to-marked
disc space narrowing at C5-C6 and C6-C7. Bilateral foraminal
narrowing at C5-C6 and C6-C7.

Upper chest: Negative.

Other: None
IMPRESSION: 1. Negative non contrasted CT appearance of the brain
2. Straightening of the cervical spine with degenerative change. No
acute osseous abnormality

## 2020-05-11 ENCOUNTER — Other Ambulatory Visit: Payer: Self-pay | Admitting: Family Medicine

## 2020-05-14 ENCOUNTER — Encounter: Payer: Self-pay | Admitting: Family Medicine

## 2020-05-14 ENCOUNTER — Ambulatory Visit (INDEPENDENT_AMBULATORY_CARE_PROVIDER_SITE_OTHER): Payer: BC Managed Care – PPO | Admitting: Family Medicine

## 2020-05-14 VITALS — BP 121/80 | HR 79 | Wt 211.1 lb

## 2020-05-14 DIAGNOSIS — E785 Hyperlipidemia, unspecified: Secondary | ICD-10-CM | POA: Diagnosis not present

## 2020-05-14 DIAGNOSIS — E119 Type 2 diabetes mellitus without complications: Secondary | ICD-10-CM | POA: Diagnosis not present

## 2020-05-14 DIAGNOSIS — Z23 Encounter for immunization: Secondary | ICD-10-CM | POA: Diagnosis not present

## 2020-05-14 DIAGNOSIS — E039 Hypothyroidism, unspecified: Secondary | ICD-10-CM

## 2020-05-14 LAB — POCT GLYCOSYLATED HEMOGLOBIN (HGB A1C): Hemoglobin A1C: 9.8 % — AB (ref 4.0–5.6)

## 2020-05-14 MED ORDER — XIGDUO XR 5-1000 MG PO TB24
1.0000 | ORAL_TABLET | Freq: Every day | ORAL | 2 refills | Status: DC
Start: 2020-05-14 — End: 2020-11-11

## 2020-05-14 MED ORDER — OZEMPIC (1 MG/DOSE) 2 MG/1.5ML ~~LOC~~ SOPN: 1.0000 mg | PEN_INJECTOR | SUBCUTANEOUS | 1 refills | Status: AC

## 2020-05-14 NOTE — Patient Instructions (Signed)
Let's try Ozempic to replace Trulicity Try xigduo to replace metformin.  Continue to work on diet and lifestyle change.  Have labs completed.  See me in 3 months.

## 2020-05-14 NOTE — Assessment & Plan Note (Signed)
Tolerating pitavastatin well at this time, continue at current strength.

## 2020-05-14 NOTE — Progress Notes (Signed)
Roberta Hayes - 64 y.o. female MRN [id number]  Date of birth: 04-22-1956  Subjective Chief Complaint  Patient presents with  . Diabetes    HPI Roberta Hayes is a 64 y.o. female here today for follow up visit.  She has a history of T2DM, hypothyroidism, and HLD.    Current diabetes management with metformin and trulicity.  She reports that blood sugars were running in the 180's-220's prior to making changes to her diet a few weeks ago.  Admits to eating candy almost all day while at work.  Weight is down some since last visit.  She denies increased thirst or urination.    She feels well with current dose of levothyroxine. She is taking medication as directed each day.   She is tolerating pitavastatin well for management of HLD.  No myalgias or GI upset.    ROS:  A comprehensive ROS was completed and negative except as noted per HPI    No Known Allergies  Past Medical History:  Diagnosis Date  . Diabetes mellitus without complication (HCC)   . Thyroid disease     Past Surgical History:  Procedure Laterality Date  . ABDOMINAL HYSTERECTOMY    . CHOLECYSTECTOMY    . KNEE ARTHROSCOPY Left   . TOTAL ABDOMINAL HYSTERECTOMY      Social History   Socioeconomic History  . Marital status: Married    Spouse name: Not on file  . Number of children: Not on file  . Years of education: Not on file  . Highest education level: Not on file  Occupational History  . Not on file  Tobacco Use  . Smoking status: Never Smoker  . Smokeless tobacco: Never Used  Substance and Sexual Activity  . Alcohol use: Yes    Comment: rarely  . Drug use: Never  . Sexual activity: Not on file  Other Topics Concern  . Not on file  Social History Narrative  . Not on file   Social Determinants of Health   Financial Resource Strain:   . Difficulty of Paying Living Expenses: Not on file  Food Insecurity:   . Worried About Programme researcher, broadcasting/film/video in the Last Year: Not on file  . Ran Out of Food in the  Last Year: Not on file  Transportation Needs:   . Lack of Transportation (Medical): Not on file  . Lack of Transportation (Non-Medical): Not on file  Physical Activity:   . Days of Exercise per Week: Not on file  . Minutes of Exercise per Session: Not on file  Stress:   . Feeling of Stress : Not on file  Social Connections:   . Frequency of Communication with Friends and Family: Not on file  . Frequency of Social Gatherings with Friends and Family: Not on file  . Attends Religious Services: Not on file  . Active Member of Clubs or Organizations: Not on file  . Attends Banker Meetings: Not on file  . Marital Status: Not on file    History reviewed. No pertinent family history.  Health Maintenance  Topic Date Due  . Hepatitis C Screening  Never done  . PNEUMOCOCCAL POLYSACCHARIDE VACCINE AGE 81-64 HIGH RISK  Never done  . FOOT EXAM  Never done  . OPHTHALMOLOGY EXAM  Never done  . COVID-19 Vaccine (1) Never done  . HIV Screening  Never done  . TETANUS/TDAP  Never done  . PAP SMEAR-Modifier  Never done  . MAMMOGRAM  Never done  . COLONOSCOPY  Never done  . INFLUENZA VACCINE  03/23/2020  . URINE MICROALBUMIN  09/23/2020  . HEMOGLOBIN A1C  11/11/2020     ----------------------------------------------------------------------------------------------------------------------------------------------------------------------------------------------------------------- Physical Exam BP 121/80 (BP Location: Left Arm, Patient Position: Sitting, Cuff Size: Normal)   Pulse 79   Wt 211 lb 1.9 oz (95.8 kg)   SpO2 98%   BMI 35.13 kg/m   Physical Exam Constitutional:      Appearance: Normal appearance.  HENT:     Head: Normocephalic and atraumatic.  Eyes:     General: No scleral icterus. Cardiovascular:     Rate and Rhythm: Normal rate and regular rhythm.  Pulmonary:     Effort: Pulmonary effort is normal.     Breath sounds: Normal breath sounds.  Musculoskeletal:      Cervical back: Neck supple.  Neurological:     Mental Status: She is alert.  Psychiatric:        Mood and Affect: Mood normal.        Behavior: Behavior normal.    Diabetic Foot Exam - Simple   Simple Foot Form Diabetic Foot exam was performed with the following findings: Yes 05/14/2020  9:51 PM  Visual Inspection No deformities, no ulcerations, no other skin breakdown bilaterally: Yes Sensation Testing Intact to touch and monofilament testing bilaterally: Yes Pulse Check Posterior Tibialis and Dorsalis pulse intact bilaterally: Yes Comments      ------------------------------------------------------------------------------------------------------------------------------------------------------------------------------------------------------------------- Assessment and Plan  Type 2 diabetes mellitus without complication, without long-term current use of insulin (HCC) Most recent A1c of  Lab Results  Component Value Date   HGBA1C 9.8 (A) 05/14/2020   indicates diabetes is not well controlled.  Recommend change from trulicity to Ozempic and change metofrmin to [place] as he blood sugars remain uncontrolled.  She will continue to work on dietary changes.  Counseled on healthy, low carb diet and recommend frequent activity to help with maintaining good control of blood sugars.      Acquired hypothyroidism No symptoms at this time.  Update TSH.   HLD (hyperlipidemia) Tolerating pitavastatin well at this time, continue at current strength.    Meds ordered this encounter  Medications  . Dapagliflozin-metFORMIN HCl ER (XIGDUO XR) 12-998 MG TB24    Sig: Take 1 tablet by mouth daily.    Dispense:  90 tablet    Refill:  2  . Semaglutide, 1 MG/DOSE, (OZEMPIC, 1 MG/DOSE,) 2 MG/1.5ML SOPN    Sig: Inject 1 mg into the skin once a week.    Dispense:  9 mL    Refill:  1    Return in about 3 months (around 08/13/2020) for DM.    This visit occurred during the SARS-CoV-2 public  health emergency.  Safety protocols were in place, including screening questions prior to the visit, additional usage of staff PPE, and extensive cleaning of exam room while observing appropriate contact time as indicated for disinfecting solutions.

## 2020-05-14 NOTE — Assessment & Plan Note (Signed)
Most recent A1c of  Lab Results  Component Value Date   HGBA1C 9.8 (A) 05/14/2020   indicates diabetes is not well controlled.  Recommend change from trulicity to Ozempic and change metofrmin to [place] as he blood sugars remain uncontrolled.  She will continue to work on dietary changes.  Counseled on healthy, low carb diet and recommend frequent activity to help with maintaining good control of blood sugars.

## 2020-05-14 NOTE — Assessment & Plan Note (Signed)
No symptoms at this time.  Update TSH.

## 2020-05-15 ENCOUNTER — Other Ambulatory Visit: Payer: Self-pay | Admitting: Family Medicine

## 2020-05-15 LAB — TSH: TSH: 3.53 mIU/L (ref 0.40–4.50)

## 2020-05-15 LAB — COMPLETE METABOLIC PANEL WITH GFR
AG Ratio: 1.5 (calc) (ref 1.0–2.5)
ALT: 51 U/L — ABNORMAL HIGH (ref 6–29)
AST: 49 U/L — ABNORMAL HIGH (ref 10–35)
Albumin: 4.2 g/dL (ref 3.6–5.1)
Alkaline phosphatase (APISO): 80 U/L (ref 37–153)
BUN: 13 mg/dL (ref 7–25)
CO2: 29 mmol/L (ref 20–32)
Calcium: 10 mg/dL (ref 8.6–10.4)
Chloride: 103 mmol/L (ref 98–110)
Creat: 0.74 mg/dL (ref 0.50–0.99)
GFR, Est African American: 101 mL/min/{1.73_m2} (ref >=60)
GFR, Est Non African American: 87 mL/min/{1.73_m2} (ref >=60)
Globulin: 2.8 g/dL (calc) (ref 1.9–3.7)
Glucose, Bld: 126 mg/dL (ref 65–139)
Potassium: 3.9 mmol/L (ref 3.5–5.3)
Sodium: 140 mmol/L (ref 135–146)
Total Bilirubin: 0.7 mg/dL (ref 0.2–1.2)
Total Protein: 7 g/dL (ref 6.1–8.1)

## 2020-05-15 MED ORDER — FREESTYLE LIBRE 14 DAY READER DEVI: 0 refills | Status: DC

## 2020-05-15 MED ORDER — FREESTYLE LIBRE 14 DAY SENSOR MISC: 1.0000 | 3 refills | Status: DC

## 2020-05-20 ENCOUNTER — Other Ambulatory Visit: Payer: Self-pay | Admitting: Family Medicine

## 2020-05-20 MED ORDER — BLOOD GLUCOSE MONITORING SUPPL KIT: PACK | 0 refills | Status: AC

## 2020-06-09 ENCOUNTER — Other Ambulatory Visit: Payer: Self-pay | Admitting: Family Medicine

## 2020-08-13 ENCOUNTER — Other Ambulatory Visit: Payer: Self-pay

## 2020-08-13 ENCOUNTER — Ambulatory Visit (INDEPENDENT_AMBULATORY_CARE_PROVIDER_SITE_OTHER): Payer: BC Managed Care – PPO | Admitting: Family Medicine

## 2020-08-13 ENCOUNTER — Encounter: Payer: Self-pay | Admitting: Family Medicine

## 2020-08-13 DIAGNOSIS — E119 Type 2 diabetes mellitus without complications: Secondary | ICD-10-CM

## 2020-08-13 DIAGNOSIS — E785 Hyperlipidemia, unspecified: Secondary | ICD-10-CM

## 2020-08-13 DIAGNOSIS — L409 Psoriasis, unspecified: Secondary | ICD-10-CM

## 2020-08-13 MED ORDER — BETAMETHASONE VALERATE 0.12 % EX FOAM
2.0000 | Freq: Two times a day (BID) | CUTANEOUS | 2 refills | Status: DC | PRN
Start: 2020-08-13 — End: 2021-08-07

## 2020-08-13 NOTE — Assessment & Plan Note (Signed)
She reports that Livalo will no longer be covered at the beginning of the year.  Discussed changing to Crestor every other day however she would like to see what her lipid panel looks like at the next visit.

## 2020-08-13 NOTE — Assessment & Plan Note (Signed)
She had an A1c of 7.9% through her employer just a couple of months ago.  I congratulated her on her significant amount of weight loss and better control of her diabetes.  I will plan to see her back in 3 months and will check labs at that time.  For now she will continue on current medications of Ozempic and the xigduo.

## 2020-08-13 NOTE — Progress Notes (Signed)
Roberta Hayes - 65 y.o. female MRN [id number]  Date of birth: 03/03/1956  Subjective Chief Complaint  Patient presents with  . Diabetes    HPI Roberta Hayes is a 63 year old female with history of hypothyroidism, type 2 diabetes, psoriasis, hyperlipidemia here today for follow-up visit.  Diabetes is currently managed with xigduo and Ozempic.  She was changed from Trulicity to Ozempic at her last appointment.  She has done well with this and her weight is down about 35 pounds since her last visit.  She has made significant changes to her diet and is exercising some.  She did have an A1c a couple months ago through her employer which was 7.9%.  She denies any side effects with current medications.  She reports that her insurance will not cover Livalo after the end of this year.  She reports having muscle pain with other statins in the past.  She would be open to possibly trying alternative dosing to Crestor such as every other day dosing.  She  does need a refill of betamethasone foam for her psoriasis.  This continues to work well for her.  ROS:  A comprehensive ROS was completed and negative except as noted per HPI  No Known Allergies  Past Medical History:  Diagnosis Date  . Diabetes mellitus without complication (HCC)   . Thyroid disease     Past Surgical History:  Procedure Laterality Date  . ABDOMINAL HYSTERECTOMY    . CHOLECYSTECTOMY    . KNEE ARTHROSCOPY Left   . TOTAL ABDOMINAL HYSTERECTOMY      Social History   Socioeconomic History  . Marital status: Married    Spouse name: Not on file  . Number of children: Not on file  . Years of education: Not on file  . Highest education level: Not on file  Occupational History  . Not on file  Tobacco Use  . Smoking status: Never Smoker  . Smokeless tobacco: Never Used  Substance and Sexual Activity  . Alcohol use: Yes    Comment: rarely  . Drug use: Never  . Sexual activity: Not on file  Other Topics Concern  . Not on file   Social History Narrative  . Not on file   Social Determinants of Health   Financial Resource Strain: Not on file  Food Insecurity: Not on file  Transportation Needs: Not on file  Physical Activity: Not on file  Stress: Not on file  Social Connections: Not on file    History reviewed. No pertinent family history.  Health Maintenance  Topic Date Due  . Hepatitis C Screening  Never done  . PNEUMOCOCCAL POLYSACCHARIDE VACCINE AGE 70-64 HIGH RISK  Never done  . OPHTHALMOLOGY EXAM  Never done  . HIV Screening  Never done  . TETANUS/TDAP  Never done  . MAMMOGRAM  Never done  . COLONOSCOPY  Never done  . COVID-19 Vaccine (3 - Booster for Moderna series) 06/03/2020  . URINE MICROALBUMIN  09/23/2020  . INFLUENZA VACCINE  11/20/2020 (Originally 03/23/2020)  . HEMOGLOBIN A1C  11/11/2020  . FOOT EXAM  05/14/2021  . PAP SMEAR-Modifier  Discontinued     ----------------------------------------------------------------------------------------------------------------------------------------------------------------------------------------------------------------- Physical Exam BP 115/76 (BP Location: Left Arm, Patient Position: Sitting, Cuff Size: Normal)   Pulse 94   Wt 176 lb 3.2 oz (79.9 kg)   SpO2 99%   BMI 29.32 kg/m   Physical Exam Constitutional:      Appearance: Normal appearance.  Cardiovascular:     Rate and Rhythm: Normal rate and  regular rhythm.  Pulmonary:     Effort: Pulmonary effort is normal.     Breath sounds: Normal breath sounds.  Neurological:     Mental Status: She is alert.  Psychiatric:        Mood and Affect: Mood normal.        Behavior: Behavior normal.     ------------------------------------------------------------------------------------------------------------------------------------------------------------------------------------------------------------------- Assessment and Plan  Type 2 diabetes mellitus without complication, without long-term  current use of insulin (HCC) She had an A1c of 7.9% through her employer just a couple of months ago.  I congratulated her on her significant amount of weight loss and better control of her diabetes.  I will plan to see her back in 3 months and will check labs at that time.  For now she will continue on current medications of Ozempic and the xigduo.  Psoriasis Well-controlled with current topical steroids.  Betamethasone for 1 week "  HLD (hyperlipidemia) She reports that Livalo will no longer be covered at the beginning of the year.  Discussed changing to Crestor every other day however she would like to see what her lipid panel looks like at the next visit.   Meds ordered this encounter  Medications  . Betamethasone Valerate 0.12 % foam    Sig: Apply 2 Pump topically 2 (two) times daily as needed.    Dispense:  100 g    Refill:  2    Return in about 3 months (around 11/11/2020) for T2DM.    This visit occurred during the SARS-CoV-2 public health emergency.  Safety protocols were in place, including screening questions prior to the visit, additional usage of staff PPE, and extensive cleaning of exam room while observing appropriate contact time as indicated for disinfecting solutions.

## 2020-08-13 NOTE — Patient Instructions (Signed)
Great to see you today! Please follow up in about 3 months.

## 2020-08-13 NOTE — Assessment & Plan Note (Signed)
Well-controlled with current topical steroids.  Betamethasone for 1 week "

## 2020-09-30 ENCOUNTER — Other Ambulatory Visit: Payer: Self-pay

## 2020-09-30 ENCOUNTER — Other Ambulatory Visit: Payer: Self-pay | Admitting: Family Medicine

## 2020-09-30 MED ORDER — LEVOTHYROXINE SODIUM 88 MCG PO TABS: ORAL_TABLET | ORAL | 2 refills | Status: DC

## 2020-10-02 ENCOUNTER — Other Ambulatory Visit: Payer: Self-pay | Admitting: Family Medicine

## 2020-10-08 ENCOUNTER — Other Ambulatory Visit: Payer: Self-pay | Admitting: Family Medicine

## 2020-10-10 NOTE — Telephone Encounter (Signed)
Per pharmacy, insurance will not pay for early refill. Refill not available until 10/17/20.   Spoke to patient. She has 2 injections left on current pen. Advised patient it's ok to be out for 1 week. If it was over 2 weeks she would have to start Ozempic process over. Or she could contact her insurance for a refill exemption due to her travelling.   Patient expressed understanding.

## 2020-10-13 LAB — HM MAMMOGRAPHY

## 2020-10-16 ENCOUNTER — Telehealth: Payer: Self-pay | Admitting: Neurology

## 2020-10-16 NOTE — Telephone Encounter (Signed)
Prior Authorization for Ozempic submitted via covermymeds. Awaiting response. Your information has been submitted and will be reviewed by [employer]. You may close this dialog, return to your dashboard, and perform other tasks. An electronic determination will be received in CoverMyMeds within 72-120 hours. You can see the latest determination by locating this request on your dashboard or by reopening this request. You will receive a fax copy of the determination. If [employer] has not responded in 120 hours, contact [employer] at [phone]. 

## 2020-10-17 ENCOUNTER — Encounter: Payer: Self-pay | Admitting: Family Medicine

## 2020-10-17 ENCOUNTER — Other Ambulatory Visit: Payer: Self-pay

## 2020-10-17 DIAGNOSIS — E785 Hyperlipidemia, unspecified: Secondary | ICD-10-CM

## 2020-10-17 MED ORDER — LIVALO 2 MG PO TABS: ORAL_TABLET | ORAL | 2 refills | Status: DC

## 2020-11-11 ENCOUNTER — Other Ambulatory Visit: Payer: Self-pay

## 2020-11-11 ENCOUNTER — Encounter: Payer: Self-pay | Admitting: Family Medicine

## 2020-11-11 ENCOUNTER — Ambulatory Visit: Payer: BC Managed Care – PPO | Admitting: Family Medicine

## 2020-11-11 DIAGNOSIS — E119 Type 2 diabetes mellitus without complications: Secondary | ICD-10-CM

## 2020-11-11 DIAGNOSIS — E039 Hypothyroidism, unspecified: Secondary | ICD-10-CM | POA: Diagnosis not present

## 2020-11-11 DIAGNOSIS — Z23 Encounter for immunization: Secondary | ICD-10-CM

## 2020-11-11 DIAGNOSIS — E785 Hyperlipidemia, unspecified: Secondary | ICD-10-CM

## 2020-11-11 MED ORDER — XIGDUO XR 5-1000 MG PO TB24: 1.0000 | ORAL_TABLET | Freq: Every day | ORAL | 2 refills | Status: DC

## 2020-11-11 MED ORDER — OZEMPIC (1 MG/DOSE) 4 MG/3ML ~~LOC~~ SOPN: PEN_INJECTOR | SUBCUTANEOUS | 2 refills | Status: DC

## 2020-11-11 MED ORDER — LIVALO 2 MG PO TABS: ORAL_TABLET | ORAL | 2 refills | Status: DC

## 2020-11-11 MED ORDER — LEVOTHYROXINE SODIUM 88 MCG PO TABS: ORAL_TABLET | ORAL | 2 refills | Status: DC

## 2020-11-11 NOTE — Assessment & Plan Note (Signed)
Doing well with livalo. Update lipid panel.

## 2020-11-11 NOTE — Assessment & Plan Note (Signed)
Update a1c today.  She continues to do well with xigduo and ozempic Encouraged continued weight loss efforts.  Pneumovax 23 given today.  She will schedule eye exam.

## 2020-11-11 NOTE — Patient Instructions (Signed)
Please continue current medications.  Follow up with me in 3-4 months.

## 2020-11-11 NOTE — Progress Notes (Signed)
Roberta Hayes - 65 y.o. female MRN [id number]  Date of birth: 22-Sep-1955  Subjective Chief Complaint  Patient presents with  . Follow-up    HPI Roberta Hayes is a 65 y.o. female here today for follow up of HTN, T2DM, HLD and hypothyroidism.    Diabetes has been managed with combination of xigduo and ozempic.  She is doing well with this.  She has lost an additional 5lbs since her last visit.  Blood sugars at home have been well controlled.  Denies side effects related to medication.    She is tolerating livalo for management of cholesterol.  Denies myalgias with this.    She feels good at current dose of levothyroxine.   ROS:  A comprehensive ROS was completed and negative except as noted per HPI  No Known Allergies  Past Medical History:  Diagnosis Date  . Diabetes mellitus without complication (HCC)   . Thyroid disease     Past Surgical History:  Procedure Laterality Date  . ABDOMINAL HYSTERECTOMY    . CHOLECYSTECTOMY    . KNEE ARTHROSCOPY Left   . TOTAL ABDOMINAL HYSTERECTOMY      Social History   Socioeconomic History  . Marital status: Married    Spouse name: Not on file  . Number of children: Not on file  . Years of education: Not on file  . Highest education level: Not on file  Occupational History  . Not on file  Tobacco Use  . Smoking status: Never Smoker  . Smokeless tobacco: Never Used  Substance and Sexual Activity  . Alcohol use: Yes    Comment: rarely  . Drug use: Never  . Sexual activity: Not on file  Other Topics Concern  . Not on file  Social History Narrative  . Not on file   Social Determinants of Health   Financial Resource Strain: Not on file  Food Insecurity: Not on file  Transportation Needs: Not on file  Physical Activity: Not on file  Stress: Not on file  Social Connections: Not on file    History reviewed. No pertinent family history.  Health Maintenance  Topic Date Due  . Hepatitis C Screening  Never done  .  OPHTHALMOLOGY EXAM  Never done  . TETANUS/TDAP  Never done  . COVID-19 Vaccine (3 - Booster for Moderna series) 06/03/2020  . URINE MICROALBUMIN  09/23/2020  . HEMOGLOBIN A1C  11/11/2020  . INFLUENZA VACCINE  11/20/2020 (Originally 03/23/2020)  . HIV Screening  11/11/2021 (Originally 08/01/1973)  . FOOT EXAM  05/14/2021  . MAMMOGRAM  10/13/2022  . COLONOSCOPY (Pts 45-39yrs Insurance coverage will need to be confirmed)  04/18/2025  . PNEUMOCOCCAL POLYSACCHARIDE VACCINE AGE 21-64 HIGH RISK  Completed  . HPV VACCINES  Aged Out  . PAP SMEAR-Modifier  Discontinued     ----------------------------------------------------------------------------------------------------------------------------------------------------------------------------------------------------------------- Physical Exam BP 122/81 (BP Location: Left Arm, Patient Position: Sitting, Cuff Size: Normal)   Pulse 72   Wt 172 lb 1.6 oz (78.1 kg)   SpO2 100%   BMI 28.64 kg/m   Physical Exam Constitutional:      Appearance: Normal appearance.  HENT:     Head: Normocephalic and atraumatic.  Eyes:     General: No scleral icterus. Cardiovascular:     Rate and Rhythm: Normal rate and regular rhythm.  Pulmonary:     Effort: Pulmonary effort is normal.     Breath sounds: Normal breath sounds.  Musculoskeletal:     Cervical back: Neck supple.  Neurological:  General: No focal deficit present.     Mental Status: She is alert.  Psychiatric:        Mood and Affect: Mood normal.        Behavior: Behavior normal.     ------------------------------------------------------------------------------------------------------------------------------------------------------------------------------------------------------------------- Assessment and Plan  Acquired hypothyroidism Denies new symptoms.  Update TSH.  Type 2 diabetes mellitus without complication, without long-term current use of insulin (HCC) Update a1c today.  She  continues to do well with xigduo and ozempic Encouraged continued weight loss efforts.  Pneumovax 23 given today.  She will schedule eye exam.   HLD (hyperlipidemia) Doing well with livalo. Update lipid panel.     Meds ordered this encounter  Medications  . Dapagliflozin-metFORMIN HCl ER (XIGDUO XR) 12-998 MG TB24    Sig: Take 1 tablet by mouth daily.    Dispense:  90 tablet    Refill:  2  . levothyroxine (SYNTHROID) 88 MCG tablet    Sig: TAKE 1 TABLET(88 MCG) BY MOUTH DAILY BEFORE BREAKFAST    Dispense:  90 tablet    Refill:  2  . Pitavastatin Calcium (LIVALO) 2 MG TABS    Sig: TAKE 1 TABLET(2 MG) BY MOUTH DAILY    Dispense:  90 tablet    Refill:  2  . OZEMPIC, 1 MG/DOSE, 4 MG/3ML SOPN    Sig: INJECT 1MG  INTO THE SKIN ONCE A WEEK    Dispense:  9 mL    Refill:  2    Return in about 4 months (around 03/13/2021) for T2DM.    This visit occurred during the SARS-CoV-2 public health emergency.  Safety protocols were in place, including screening questions prior to the visit, additional usage of staff PPE, and extensive cleaning of exam room while observing appropriate contact time as indicated for disinfecting solutions.

## 2020-11-11 NOTE — Assessment & Plan Note (Signed)
Denies new symptoms.  Update TSH.

## 2020-11-12 ENCOUNTER — Other Ambulatory Visit: Payer: Self-pay | Admitting: Family Medicine

## 2020-11-12 ENCOUNTER — Other Ambulatory Visit: Payer: Self-pay

## 2020-11-12 DIAGNOSIS — E039 Hypothyroidism, unspecified: Secondary | ICD-10-CM

## 2020-11-12 LAB — COMPLETE METABOLIC PANEL WITH GFR
AG Ratio: 1.5 (calc) (ref 1.0–2.5)
ALT: 17 U/L (ref 6–29)
AST: 21 U/L (ref 10–35)
Albumin: 4.2 g/dL (ref 3.6–5.1)
Alkaline phosphatase (APISO): 70 U/L (ref 37–153)
BUN: 11 mg/dL (ref 7–25)
CO2: 30 mmol/L (ref 20–32)
Calcium: 10 mg/dL (ref 8.6–10.4)
Chloride: 102 mmol/L (ref 98–110)
Creat: 0.78 mg/dL (ref 0.50–0.99)
GFR, Est African American: 94 mL/min/{1.73_m2} (ref >=60)
GFR, Est Non African American: 81 mL/min/{1.73_m2} (ref >=60)
Globulin: 2.8 g/dL (calc) (ref 1.9–3.7)
Glucose, Bld: 97 mg/dL (ref 65–99)
Potassium: 4.2 mmol/L (ref 3.5–5.3)
Sodium: 140 mmol/L (ref 135–146)
Total Bilirubin: 0.9 mg/dL (ref 0.2–1.2)
Total Protein: 7 g/dL (ref 6.1–8.1)

## 2020-11-12 LAB — LIPID PANEL W/REFLEX DIRECT LDL
Cholesterol: 171 mg/dL (ref <200)
HDL: 57 mg/dL (ref >=50)
LDL Cholesterol (Calc): 96 mg/dL (calc)
Non-HDL Cholesterol (Calc): 114 mg/dL (calc) (ref <130)
Total CHOL/HDL Ratio: 3 (calc) (ref <5.0)
Triglycerides: 85 mg/dL (ref <150)

## 2020-11-12 LAB — MICROALBUMIN / CREATININE URINE RATIO
Creatinine, Urine: 85 mg/dL (ref 20–275)
Microalb Creat Ratio: 2 mcg/mg creat (ref <30)
Microalb, Ur: 0.2 mg/dL

## 2020-11-12 LAB — TSH: TSH: 0.26 mIU/L — ABNORMAL LOW (ref 0.40–4.50)

## 2020-11-12 MED ORDER — LEVOTHYROXINE SODIUM 75 MCG PO TABS: ORAL_TABLET | ORAL | 1 refills | Status: DC

## 2020-12-29 ENCOUNTER — Other Ambulatory Visit: Payer: Self-pay | Admitting: Family Medicine

## 2021-01-07 ENCOUNTER — Other Ambulatory Visit: Payer: Self-pay

## 2021-01-07 NOTE — Telephone Encounter (Signed)
Patient advised to have labs completed for TSH repeated from 6 weeks prior.

## 2021-01-29 ENCOUNTER — Other Ambulatory Visit: Payer: Self-pay

## 2021-01-29 LAB — TSH: TSH: 2.35 mIU/L (ref 0.40–4.50)

## 2021-01-29 MED ORDER — GLUCOSE BLOOD VI STRP: ORAL_STRIP | 12 refills | Status: AC

## 2021-01-29 NOTE — Progress Notes (Signed)
Patient has been advised of results and recommendations.   Pt needs [employer] form completed. She will drop it by.  Requesting One Touch test strips

## 2021-03-13 ENCOUNTER — Ambulatory Visit (INDEPENDENT_AMBULATORY_CARE_PROVIDER_SITE_OTHER): Payer: Managed Care, Other (non HMO) | Admitting: Family Medicine

## 2021-03-13 ENCOUNTER — Encounter: Payer: Self-pay | Admitting: Family Medicine

## 2021-03-13 VITALS — BP 108/66 | HR 96 | Temp 97.1°F | Ht 65.0 in | Wt 158.0 lb

## 2021-03-13 DIAGNOSIS — E785 Hyperlipidemia, unspecified: Secondary | ICD-10-CM

## 2021-03-13 DIAGNOSIS — E119 Type 2 diabetes mellitus without complications: Secondary | ICD-10-CM | POA: Diagnosis not present

## 2021-03-13 DIAGNOSIS — E039 Hypothyroidism, unspecified: Secondary | ICD-10-CM | POA: Diagnosis not present

## 2021-03-13 DIAGNOSIS — Z7689 Persons encountering health services in other specified circumstances: Secondary | ICD-10-CM

## 2021-03-13 DIAGNOSIS — Z23 Encounter for immunization: Secondary | ICD-10-CM

## 2021-03-13 LAB — POCT GLYCOSYLATED HEMOGLOBIN (HGB A1C): HbA1c, POC (controlled diabetic range): 5.4 % (ref 0.0–7.0)

## 2021-03-13 NOTE — Patient Instructions (Signed)
Preventive Care 68-63 Years Old, Female Preventive care refers to lifestyle choices and visits with your health care provider that can promote health and wellness. This includes: A yearly physical exam. This is also called an annual wellness visit. Regular dental and eye exams. Immunizations. Screening for certain conditions. Healthy lifestyle choices, such as: Eating a healthy diet. Getting regular exercise. Not using drugs or products that contain nicotine and tobacco. Limiting alcohol use. What can I expect for my preventive care visit? Physical exam Your health care provider will check your: Height and weight. These may be used to calculate your BMI (body mass index). BMI is a measurement that tells if you are at a healthy weight. Heart rate and blood pressure. Body temperature. Skin for abnormal spots. Counseling Your health care provider may ask you questions about your: Past medical problems. Family's medical history. Alcohol, tobacco, and drug use. Emotional well-being. Home life and relationship well-being. Sexual activity. Diet, exercise, and sleep habits. Work and work Statistician. Access to firearms. Method of birth control. Menstrual cycle. Pregnancy history. What immunizations do I need?  Vaccines are usually given at various ages, according to a schedule. Your health care provider will recommend vaccines for you based on your age, medicalhistory, and lifestyle or other factors, such as travel or where you work. What tests do I need? Blood tests Lipid and cholesterol levels. These may be checked every 5 years, or more often if you are over 37 years old. Hepatitis C test. Hepatitis B test. Screening Lung cancer screening. You may have this screening every year starting at age 30 if you have a 30-pack-year history of smoking and currently smoke or have quit within the past 15 years. Colorectal cancer screening. All adults should have this screening starting at  age 23 and continuing until age 3. Your health care provider may recommend screening at age 88 if you are at increased risk. You will have tests every 1-10 years, depending on your results and the type of screening test. Diabetes screening. This is done by checking your blood sugar (glucose) after you have not eaten for a while (fasting). You may have this done every 1-3 years. Mammogram. This may be done every 1-2 years. Talk with your health care provider about when you should start having regular mammograms. This may depend on whether you have a family history of breast cancer. BRCA-related cancer screening. This may be done if you have a family history of breast, ovarian, tubal, or peritoneal cancers. Pelvic exam and Pap test. This may be done every 3 years starting at age 79. Starting at age 54, this may be done every 5 years if you have a Pap test in combination with an HPV test. Other tests STD (sexually transmitted disease) testing, if you are at risk. Bone density scan. This is done to screen for osteoporosis. You may have this scan if you are at high risk for osteoporosis. Talk with your health care provider about your test results, treatment options,and if necessary, the need for more tests. Follow these instructions at home: Eating and drinking  Eat a diet that includes fresh fruits and vegetables, whole grains, lean protein, and low-fat dairy products. Take vitamin and mineral supplements as recommended by your health care provider. Do not drink alcohol if: Your health care provider tells you not to drink. You are pregnant, may be pregnant, or are planning to become pregnant. If you drink alcohol: Limit how much you have to 0-1 drink a day. Be aware  of how much alcohol is in your drink. In the [place] one drink equals one 12 oz bottle of beer (355 mL), one 5 oz glass of wine (148 mL), or one 1 oz glass of hard liquor (44 mL).  Lifestyle Take daily care of your teeth and  gums. Brush your teeth every morning and night with fluoride toothpaste. Floss one time each day. Stay active. Exercise for at least 30 minutes 5 or more days each week. Do not use any products that contain nicotine or tobacco, such as cigarettes, e-cigarettes, and chewing tobacco. If you need help quitting, ask your health care provider. Do not use drugs. If you are sexually active, practice safe sex. Use a condom or other form of protection to prevent STIs (sexually transmitted infections). If you do not wish to become pregnant, use a form of birth control. If you plan to become pregnant, see your health care provider for a prepregnancy visit. If told by your health care provider, take low-dose aspirin daily starting at age 29. Find healthy ways to cope with stress, such as: Meditation, yoga, or listening to music. Journaling. Talking to a trusted person. Spending time with friends and family. Safety Always wear your seat belt while driving or riding in a vehicle. Do not drive: If you have been drinking alcohol. Do not ride with someone who has been drinking. When you are tired or distracted. While texting. Wear a helmet and other protective equipment during sports activities. If you have firearms in your house, make sure you follow all gun safety procedures. What's next? Visit your health care provider once a year for an annual wellness visit. Ask your health care provider how often you should have your eyes and teeth checked. Stay up to date on all vaccines. This information is not intended to replace advice given to you by your health care provider. Make sure you discuss any questions you have with your healthcare provider. Document Revised: 05/13/2020 Document Reviewed: 04/20/2018 Elsevier Patient Education  2022 [employer].

## 2021-03-15 DIAGNOSIS — Z7689 Persons encountering health services in other specified circumstances: Secondary | ICD-10-CM | POA: Insufficient documentation

## 2021-03-15 NOTE — Progress Notes (Signed)
Roberta Hayes - 65 y.o. female MRN [id number]  Date of birth: 1956/01/09  Subjective Chief Complaint  Patient presents with   Weight Check    HPI Roberta Hayes is a 65 year old female here today for follow-up visit.  She has history of type 2 diabetes as well as hypertension and hyperlipidemia.  She is working hard on making dietary and exercise changes over the past year.  Her weight has went up from 218 pounds to 158 pounds today over the past year.  She is doing well with Ozempic and notices decreased appetite with this.  Her blood sugars have been well controlled with this as well as Xigduo.  She has not noted any significant side effects from this.  She is taking for to have a statin for associated hyperlipidemia.  She is tolerating this well.  She has not tolerated other statins in the past.  Her levothyroxine was adjusted at her last appointment with normal TSH approximately 1-1/2 months ago.  Has been able to discontinue her blood pressure medications due to improvements in her weight and lifestyle.  ROS:  A comprehensive ROS was completed and negative except as noted per HPI  No Known Allergies  Past Medical History:  Diagnosis Date   Diabetes mellitus without complication (HCC)    Thyroid disease     Past Surgical History:  Procedure Laterality Date   ABDOMINAL HYSTERECTOMY     CHOLECYSTECTOMY     KNEE ARTHROSCOPY Left    TOTAL ABDOMINAL HYSTERECTOMY      Social History   Socioeconomic History   Marital status: Married    Spouse name: Not on file   Number of children: Not on file   Years of education: Not on file   Highest education level: Not on file  Occupational History   Not on file  Tobacco Use   Smoking status: Never   Smokeless tobacco: Never  Substance and Sexual Activity   Alcohol use: Yes    Comment: rarely   Drug use: Never   Sexual activity: Not on file  Other Topics Concern   Not on file  Social History Narrative   Not on file   Social  Determinants of Health   Financial Resource Strain: Not on file  Food Insecurity: Not on file  Transportation Needs: Not on file  Physical Activity: Not on file  Stress: Not on file  Social Connections: Not on file    History reviewed. No pertinent family history.  Health Maintenance  Topic Date Due   OPHTHALMOLOGY EXAM  Never done   Hepatitis C Screening  Never done   COVID-19 Vaccine (4 - Booster for Moderna series) 12/08/2020   HIV Screening  11/11/2021 (Originally 08/01/1973)   INFLUENZA VACCINE  03/23/2021   FOOT EXAM  05/14/2021   HEMOGLOBIN A1C  09/13/2021   Pneumococcal Vaccine 64-69 Years old (2 - PCV) 11/11/2021   URINE MICROALBUMIN  11/11/2021   MAMMOGRAM  10/13/2022   COLONOSCOPY (Pts 45-20yrs Insurance coverage will need to be confirmed)  04/18/2025   TETANUS/TDAP  03/14/2031   PNEUMOCOCCAL POLYSACCHARIDE VACCINE AGE 44-64 HIGH RISK  Completed   Zoster Vaccines- Shingrix  Completed   HPV VACCINES  Aged Out   PAP SMEAR-Modifier  Discontinued     ----------------------------------------------------------------------------------------------------------------------------------------------------------------------------------------------------------------- Physical Exam BP 108/66 (BP Location: Left Arm, Patient Position: Sitting, Cuff Size: Small)   Pulse 96   Temp (!) 97.1 F (36.2 C)   Ht 5\' 5"  (1.651 m)   Wt 158 lb (71.7 kg)  SpO2 99%   BMI 26.29 kg/m   Physical Exam Constitutional:      Appearance: Normal appearance.  HENT:     Head: Normocephalic and atraumatic.  Eyes:     General: No scleral icterus. Cardiovascular:     Rate and Rhythm: Normal rate and regular rhythm.  Pulmonary:     Effort: Pulmonary effort is normal.     Breath sounds: Normal breath sounds.  Musculoskeletal:     Cervical back: Neck supple.  Neurological:     General: No focal deficit present.     Mental Status: She is alert.  Psychiatric:        Mood and Affect: Mood  normal.        Behavior: Behavior normal.    ------------------------------------------------------------------------------------------------------------------------------------------------------------------------------------------------------------------- Assessment and Plan  Acquired hypothyroidism Recent TSH within normal limits.  She will continue on current dose of levothyroxine.  Type 2 diabetes mellitus without complication, without long-term current use of insulin (HCC) She has attained excellent control of her diabetes with better management of her weight and lifestyle as well as Xigduo and Ozempic.  We discussed that if her blood sugars remain well controlled we can consider discontinuing Xigduo at her next follow-up.  HLD (hyperlipidemia) Tolerating Livalo well.  Continue at current strength.  Encounter for weight management She is doing very well with weight loss.  She will continue to work on diet and lifestyle change.  We will continue Ozempic as I think this is beneficial for managing her weight as well.   No orders of the defined types were placed in this encounter.   Return in about 6 months (around 09/13/2021) for HTN/DM.    This visit occurred during the SARS-CoV-2 public health emergency.  Safety protocols were in place, including screening questions prior to the visit, additional usage of staff PPE, and extensive cleaning of exam room while observing appropriate contact time as indicated for disinfecting solutions.

## 2021-03-15 NOTE — Assessment & Plan Note (Signed)
She has attained excellent control of her diabetes with better management of her weight and lifestyle as well as Xigduo and Ozempic.  We discussed that if her blood sugars remain well controlled we can consider discontinuing Xigduo at her next follow-up.

## 2021-03-15 NOTE — Assessment & Plan Note (Signed)
She is doing very well with weight loss.  She will continue to work on diet and lifestyle change.  We will continue Ozempic as I think this is beneficial for managing her weight as well.

## 2021-03-15 NOTE — Assessment & Plan Note (Signed)
Recent TSH within normal limits.  She will continue on current dose of levothyroxine.

## 2021-03-15 NOTE — Assessment & Plan Note (Signed)
Tolerating Livalo well.  Continue at current strength.

## 2021-04-01 LAB — HM DIABETES EYE EXAM

## 2021-04-16 ENCOUNTER — Telehealth: Payer: Self-pay

## 2021-04-16 NOTE — Telephone Encounter (Signed)
Pt lvm stating, "Other around me are concerned that I'm cold all the time".   Please call pt and schedule for labs and discussion about feeling cold.   Thanks

## 2021-04-17 NOTE — Telephone Encounter (Signed)
Appointment made already

## 2021-04-22 ENCOUNTER — Ambulatory Visit: Payer: Managed Care, Other (non HMO) | Admitting: Family Medicine

## 2021-04-22 ENCOUNTER — Other Ambulatory Visit: Payer: Self-pay

## 2021-04-22 ENCOUNTER — Telehealth: Payer: Self-pay

## 2021-04-22 MED ORDER — ACYCLOVIR 5 % EX OINT: 1.0000 "application " | TOPICAL_OINTMENT | CUTANEOUS | 1 refills | Status: DC | PRN

## 2021-04-22 NOTE — Telephone Encounter (Signed)
Pt is requesting lab orders for possible iron deficiency contributing to her "being cold all the time.

## 2021-04-28 ENCOUNTER — Other Ambulatory Visit: Payer: Self-pay | Admitting: Family Medicine

## 2021-04-28 DIAGNOSIS — R252 Cramp and spasm: Secondary | ICD-10-CM

## 2021-04-28 NOTE — Telephone Encounter (Signed)
Orders placed.

## 2021-04-28 NOTE — Telephone Encounter (Signed)
Patient has been advised of lab orders. Advised [facility] in office and downstairs available.

## 2021-05-07 LAB — CBC WITH DIFFERENTIAL/PLATELET
Absolute Monocytes: 402 cells/uL (ref 200–950)
Basophils Absolute: 49 cells/uL (ref 0–200)
Basophils Relative: 1 %
Eosinophils Absolute: 103 cells/uL (ref 15–500)
Eosinophils Relative: 2.1 %
HCT: 41 % (ref 35.0–45.0)
Hemoglobin: 13.7 g/dL (ref 11.7–15.5)
Lymphs Abs: 1906 cells/uL (ref 850–3900)
MCH: 30.4 pg (ref 27.0–33.0)
MCHC: 33.4 g/dL (ref 32.0–36.0)
MCV: 91.1 fL (ref 80.0–100.0)
MPV: 12.2 fL (ref 7.5–12.5)
Monocytes Relative: 8.2 %
Neutro Abs: 2440 cells/uL (ref 1500–7800)
Neutrophils Relative %: 49.8 %
Platelets: 196 10*3/uL (ref 140–400)
RBC: 4.5 10*6/uL (ref 3.80–5.10)
RDW: 12 % (ref 11.0–15.0)
Total Lymphocyte: 38.9 %
WBC: 4.9 10*3/uL (ref 3.8–10.8)

## 2021-05-07 LAB — IRON,TIBC AND FERRITIN PANEL
%SAT: 38 % (calc) (ref 16–45)
Ferritin: 81 ng/mL (ref 16–288)
Iron: 123 ug/dL (ref 45–160)
TIBC: 327 mcg/dL (calc) (ref 250–450)

## 2021-07-15 ENCOUNTER — Telehealth: Payer: Self-pay

## 2021-07-15 NOTE — Telephone Encounter (Signed)
Pt lvm stating her grandkids have flu, strep, and ear infections. Requesting penicillin or amoxicillin be sent to Rx.   Returned pt's call. Advised her to come by and get swabbed for Flu and Strep.

## 2021-07-28 ENCOUNTER — Other Ambulatory Visit: Payer: Self-pay

## 2021-07-28 DIAGNOSIS — E119 Type 2 diabetes mellitus without complications: Secondary | ICD-10-CM

## 2021-07-28 MED ORDER — OZEMPIC (1 MG/DOSE) 4 MG/3ML ~~LOC~~ SOPN: PEN_INJECTOR | SUBCUTANEOUS | 2 refills | Status: DC

## 2021-08-07 ENCOUNTER — Other Ambulatory Visit: Payer: Self-pay

## 2021-08-07 MED ORDER — BETAMETHASONE VALERATE 0.12 % EX FOAM
2.0000 | Freq: Two times a day (BID) | CUTANEOUS | 2 refills | Status: DC | PRN
Start: 2021-08-07 — End: 2024-07-13

## 2021-08-10 ENCOUNTER — Other Ambulatory Visit: Payer: Self-pay

## 2021-08-10 DIAGNOSIS — E039 Hypothyroidism, unspecified: Secondary | ICD-10-CM

## 2021-08-10 MED ORDER — LEVOTHYROXINE SODIUM 75 MCG PO TABS: ORAL_TABLET | ORAL | 1 refills | Status: DC

## 2021-10-07 ENCOUNTER — Encounter: Payer: Self-pay | Admitting: Family Medicine

## 2021-10-07 ENCOUNTER — Ambulatory Visit: Payer: Managed Care, Other (non HMO) | Admitting: Family Medicine

## 2021-10-07 ENCOUNTER — Other Ambulatory Visit: Payer: Self-pay

## 2021-10-07 VITALS — BP 119/77 | HR 82 | Temp 97.3°F | Ht 65.0 in | Wt 172.8 lb

## 2021-10-07 DIAGNOSIS — E785 Hyperlipidemia, unspecified: Secondary | ICD-10-CM

## 2021-10-07 DIAGNOSIS — M545 Low back pain, unspecified: Secondary | ICD-10-CM | POA: Diagnosis not present

## 2021-10-07 DIAGNOSIS — E119 Type 2 diabetes mellitus without complications: Secondary | ICD-10-CM | POA: Diagnosis not present

## 2021-10-07 DIAGNOSIS — E039 Hypothyroidism, unspecified: Secondary | ICD-10-CM | POA: Diagnosis not present

## 2021-10-07 DIAGNOSIS — M5416 Radiculopathy, lumbar region: Secondary | ICD-10-CM | POA: Insufficient documentation

## 2021-10-07 LAB — POCT GLYCOSYLATED HEMOGLOBIN (HGB A1C): HbA1c, POC (controlled diabetic range): 5.3 % (ref 0.0–7.0)

## 2021-10-07 MED ORDER — MELOXICAM 15 MG PO TABS: 15.0000 mg | ORAL_TABLET | Freq: Every day | ORAL | 0 refills | Status: DC | PRN

## 2021-10-07 MED ORDER — SEMAGLUTIDE (2 MG/DOSE) 8 MG/3ML ~~LOC~~ SOPN: 2.0000 mg | PEN_INJECTOR | SUBCUTANEOUS | 1 refills | Status: AC

## 2021-10-07 NOTE — Progress Notes (Signed)
Roberta Hayes - 66 y.o. female MRN [id number]  Date of birth: [birth year]  Subjective Chief Complaint  Patient presents with   Back Pain    HPI Roberta Hayes is a 66 year old female here today for follow-up visit.  She reports she is doing well at this time.  Blood sugars remain well controlled.  Her weight is up some since last visit.  Reports that she has not been quite as disciplined with her diet.  She also has been a little less active.  Tolerating Ozempic and Xigduo at current strength.  She is having some lower back pain as well.  Recently started a water aerobics class and thought it may be from overuse due to this however symptoms have not really improved.  She denies radiation of symptoms, numbness, weakness or tingling.  Feels well at current dose of levothyroxine.  ROS:  A comprehensive ROS was completed and negative except as noted per HPI   No Known Allergies  Past Medical History:  Diagnosis Date   Diabetes mellitus without complication (HCC)    Thyroid disease     Past Surgical History:  Procedure Laterality Date   ABDOMINAL HYSTERECTOMY     CHOLECYSTECTOMY     KNEE ARTHROSCOPY Left    TOTAL ABDOMINAL HYSTERECTOMY      Social History   Socioeconomic History   Marital status: Married    Spouse name: Not on file   Number of children: Not on file   Years of education: Not on file   Highest education level: Not on file  Occupational History   Not on file  Tobacco Use   Smoking status: Never   Smokeless tobacco: Never  Substance and Sexual Activity   Alcohol use: Yes    Comment: rarely   Drug use: Never   Sexual activity: Not on file  Other Topics Concern   Not on file  Social History Narrative   Not on file   Social Determinants of Health   Financial Resource Strain: Not on file  Food Insecurity: Not on file  Transportation Needs: Not on file  Physical Activity: Not on file  Stress: Not on file  Social Connections: Not on file    History  reviewed. No pertinent family history.  Health Maintenance  Topic Date Due   OPHTHALMOLOGY EXAM  Never done   Hepatitis C Screening  Never done   COVID-19 Vaccine (4 - Booster for Moderna series) 11/04/2020   URINE MICROALBUMIN  11/11/2021   HIV Screening  11/11/2021 (Originally 08[birth year]01/1973)   HEMOGLOBIN A1C  04/06/2022   FOOT EXAM  10/07/2022   MAMMOGRAM  10/13/2022   COLONOSCOPY (Pts 45-23yrs Insurance coverage will need to be confirmed)  04/18/2025   TETANUS/TDAP  03/14/2031   INFLUENZA VACCINE  Completed   Zoster Vaccines- Shingrix  Completed   HPV VACCINES  Aged Out   PAP SMEAR-Modifier  Discontinued     ----------------------------------------------------------------------------------------------------------------------------------------------------------------------------------------------------------------- Physical Exam BP 119/77 (BP Location: Left Arm, Patient Position: Sitting, Cuff Size: Normal)    Pulse 82    Temp (!) 97.3 F (36.3 C)    Ht 5\' 5"  (1.651 m)    Wt 172 lb 12.8 oz (78.4 kg)    SpO2 100%    BMI 28.76 kg/m   Physical Exam Constitutional:      Appearance: Normal appearance.  Eyes:     General: No scleral icterus. Cardiovascular:     Rate and Rhythm: Normal rate and regular rhythm.  Pulmonary:     Effort: Pulmonary effort  is normal.     Breath sounds: Normal breath sounds.  Musculoskeletal:     Cervical back: Neck supple.  Neurological:     Mental Status: She is alert.  Psychiatric:        Mood and Affect: Mood normal.        Behavior: Behavior normal.    ------------------------------------------------------------------------------------------------------------------------------------------------------------------------------------------------------------------- Assessment and Plan  Type 2 diabetes mellitus without complication, without long-term current use of insulin (HCC) Her weight has increased some since last visit however blood sugars  remain well controlled.  We discussed dropping Xigduo and increasing Ozempic to 2 mg weekly.  Continue to work on dietary change.  Acquired hypothyroidism Doing well with current dose of levothyroxine.  Continue at current strength.  HLD (hyperlipidemia) Tolerating Livalo well.  Low back pain Adding meloxicam daily as needed.  Given handout on spine conditioning program.   Meds ordered this encounter  Medications   Semaglutide, 2 MG/DOSE, 8 MG/3ML SOPN    Sig: Inject 2 mg as directed once a week.    Dispense:  9 mL    Refill:  1   meloxicam (MOBIC) 15 MG tablet    Sig: Take 1 tablet (15 mg total) by mouth daily as needed for pain.    Dispense:  30 tablet    Refill:  0    No follow-ups on file.    This visit occurred during the SARS-CoV-2 public health emergency.  Safety protocols were in place, including screening questions prior to the visit, additional usage of staff PPE, and extensive cleaning of exam room while observing appropriate contact time as indicated for disinfecting solutions.

## 2021-10-07 NOTE — Assessment & Plan Note (Signed)
Doing well with current dose of levothyroxine.  Continue at current strength. 

## 2021-10-07 NOTE — Assessment & Plan Note (Signed)
Adding meloxicam daily as needed.  Given handout on spine conditioning program.

## 2021-10-07 NOTE — Patient Instructions (Signed)
Let's increase Ozempic to 2mg .  Stop Xigduo.   Try meloxicam as needed for back pain.

## 2021-10-07 NOTE — Assessment & Plan Note (Signed)
Tolerating Livalo well.

## 2021-10-07 NOTE — Assessment & Plan Note (Signed)
Her weight has increased some since last visit however blood sugars remain well controlled.  We discussed dropping Xigduo and increasing Ozempic to 2 mg weekly.  Continue to work on dietary change.

## 2021-10-12 ENCOUNTER — Telehealth: Payer: Self-pay

## 2021-10-12 LAB — HM MAMMOGRAPHY

## 2021-10-12 NOTE — Telephone Encounter (Signed)
Initiated Prior authorization TFT:[id number] (1 MG/DOSE) 4MG /3ML pen-injectors Via: Covermymeds Case/Key:[id number] Status: approved as of 10/12/21 Reason:Coverage Start Date:10/12/2021;Coverage End Date:10/12/2022 Notified Pt via: pt does not have Mychart,Called to inform pt of approval

## 2021-10-14 ENCOUNTER — Encounter: Payer: Self-pay | Admitting: Family Medicine

## 2021-10-28 ENCOUNTER — Other Ambulatory Visit: Payer: Self-pay

## 2021-10-28 ENCOUNTER — Encounter: Payer: Self-pay | Admitting: Family Medicine

## 2021-10-28 ENCOUNTER — Ambulatory Visit (INDEPENDENT_AMBULATORY_CARE_PROVIDER_SITE_OTHER): Payer: Managed Care, Other (non HMO) | Admitting: Family Medicine

## 2021-10-28 VITALS — BP 106/68 | HR 66 | Temp 98.2°F | Ht 65.0 in | Wt 168.0 lb

## 2021-10-28 DIAGNOSIS — E785 Hyperlipidemia, unspecified: Secondary | ICD-10-CM

## 2021-10-28 DIAGNOSIS — Z1159 Encounter for screening for other viral diseases: Secondary | ICD-10-CM

## 2021-10-28 DIAGNOSIS — E119 Type 2 diabetes mellitus without complications: Secondary | ICD-10-CM

## 2021-10-28 DIAGNOSIS — Z8639 Personal history of other endocrine, nutritional and metabolic disease: Secondary | ICD-10-CM

## 2021-10-28 DIAGNOSIS — E039 Hypothyroidism, unspecified: Secondary | ICD-10-CM | POA: Diagnosis not present

## 2021-10-28 DIAGNOSIS — Z0000 Encounter for general adult medical examination without abnormal findings: Secondary | ICD-10-CM | POA: Diagnosis not present

## 2021-10-28 DIAGNOSIS — E559 Vitamin D deficiency, unspecified: Secondary | ICD-10-CM

## 2021-10-28 DIAGNOSIS — Z8739 Personal history of other diseases of the musculoskeletal system and connective tissue: Secondary | ICD-10-CM

## 2021-10-28 NOTE — Patient Instructions (Signed)

## 2021-10-28 NOTE — Assessment & Plan Note (Signed)
Well adult ?Orders Placed This Encounter  ?Procedures  ?? DG Bone Density  ?  Standing Status:   Future  ?  Standing Expiration Date:   10/29/2022  ?  Order Specific Question:   Reason for Exam (SYMPTOM  OR DIAGNOSIS REQUIRED)  ?  Answer:   history of osteopenia, chronic steroid use.  ?  Order Specific Question:   Preferred imaging location?  ?  Answer:   [facility]  ?? COMPLETE METABOLIC PANEL WITH GFR  ?? CBC with Differential  ?? Lipid Panel w/reflex Direct LDL  ?? Hepatitis C Antibody  ?? Urine Microalbumin w/creat. ratio  ?? TSH  ?? Vitamin D (25 hydroxy)  ?? Iron, TIBC and Ferritin Panel  ?Screenings: We will go ahead and order DEXA scan due to her chronic topical steroid use as well as strong family history of osteoporosis. ?Immunizations: Up-to-date ?Anticipatory guidance/risk factor reduction: Recommendations per AVS. ?

## 2021-10-28 NOTE — Progress Notes (Signed)
?Roberta Hayes - 66 y.o. female MRN [id number]  Date of birth: 1955/11/28 ? ?Subjective ?Chief Complaint  ?Patient presents with  ? Annual Exam  ? ? ?HPI ?Roberta Hayes is a 66 year old female here today for annual exam.  Overall she is doing well at this time.  She did have recent follow-up for her diabetes and this remains well controlled.  She does request to have a bone density test due to chronic topical steroid use as well as her mother and sister having osteoporosis. ? ?She is continue to exercise regularly.  She feels like her diet is fairly healthy at this point. ? ?She rarely consumes alcohol. ? ?Screenings: ?Cervical cancer screening: Status post hysterectomy ?Mammogram: Up-to-date ?Colon cancer screening: Up-to-date ?Lung cancer screening: Not indicated ? ?Review of Systems  ?Constitutional:  Negative for chills, fever, malaise/fatigue and weight loss.  ?HENT:  Negative for congestion, ear pain and sore throat.   ?Eyes:  Negative for blurred vision, double vision and pain.  ?Respiratory:  Negative for cough and shortness of breath.   ?Cardiovascular:  Negative for chest pain and palpitations.  ?Gastrointestinal:  Negative for abdominal pain, blood in stool, constipation, heartburn and nausea.  ?Genitourinary:  Negative for dysuria and urgency.  ?Musculoskeletal:  Negative for joint pain and myalgias.  ?Neurological:  Negative for dizziness and headaches.  ?Endo/Heme/Allergies:  Does not bruise/bleed easily.  ?Psychiatric/Behavioral:  Negative for depression. The patient is not nervous/anxious and does not have insomnia.   ? ?No Known Allergies ? ?Past Medical History:  ?Diagnosis Date  ? Diabetes mellitus without complication (HCC)   ? Thyroid disease   ? ? ?Past Surgical History:  ?Procedure Laterality Date  ? ABDOMINAL HYSTERECTOMY    ? CHOLECYSTECTOMY    ? KNEE ARTHROSCOPY Left   ? TOTAL ABDOMINAL HYSTERECTOMY    ? ? ?Social History  ? ?Socioeconomic History  ? Marital status: Married  ?  Spouse name:  Not on file  ? Number of children: Not on file  ? Years of education: Not on file  ? Highest education level: Not on file  ?Occupational History  ? Not on file  ?Tobacco Use  ? Smoking status: Never  ? Smokeless tobacco: Never  ?Substance and Sexual Activity  ? Alcohol use: Yes  ?  Comment: rarely  ? Drug use: Never  ? Sexual activity: Not on file  ?Other Topics Concern  ? Not on file  ?Social History Narrative  ? Not on file  ? ?Social Determinants of Health  ? ?Financial Resource Strain: Not on file  ?Food Insecurity: Not on file  ?Transportation Needs: Not on file  ?Physical Activity: Not on file  ?Stress: Not on file  ?Social Connections: Not on file  ? ? ?History reviewed. No pertinent family history. ? ?Health Maintenance  ?Topic Date Due  ? Hepatitis C Screening  Never done  ? COVID-19 Vaccine (4 - Booster for Moderna series) 11/04/2020  ? URINE MICROALBUMIN  11/11/2021  ? HIV Screening  11/11/2021 (Originally 08[birth year]01/1973)  ? OPHTHALMOLOGY EXAM  04/01/2022  ? HEMOGLOBIN A1C  04/06/2022  ? FOOT EXAM  10/07/2022  ? MAMMOGRAM  10/13/2022  ? COLONOSCOPY (Pts 45-22yrs Insurance coverage will need to be confirmed)  04/18/2025  ? TETANUS/TDAP  03/14/2031  ? INFLUENZA VACCINE  Completed  ? Zoster Vaccines- Shingrix  Completed  ? HPV VACCINES  Aged Out  ? PAP SMEAR-Modifier  Discontinued  ? ? ? ?----------------------------------------------------------------------------------------------------------------------------------------------------------------------------------------------------------------- ?Physical Exam ?BP 106/68   Pulse 66   Temp  98.2 ?F (36.8 ?C)   Ht 5\' 5"  (1.651 m)   Wt 168 lb (76.2 kg)   SpO2 98%   BMI 27.96 kg/m?  ? ?Physical Exam ?Constitutional:   ?   General: She is not in acute distress. ?HENT:  ?   Head: Normocephalic and atraumatic.  ?   Right Ear: Tympanic membrane and ear canal normal.  ?   Left Ear: Tympanic membrane and ear canal normal.  ?   Nose: Nose normal.  ?Eyes:  ?   General:  No scleral icterus. ?   Conjunctiva/sclera: Conjunctivae normal.  ?Neck:  ?   Thyroid: No thyromegaly.  ?Cardiovascular:  ?   Rate and Rhythm: Normal rate and regular rhythm.  ?   Heart sounds: Normal heart sounds.  ?Pulmonary:  ?   Effort: Pulmonary effort is normal.  ?   Breath sounds: Normal breath sounds.  ?Abdominal:  ?   General: Bowel sounds are normal. There is no distension.  ?   Palpations: Abdomen is soft.  ?   Tenderness: There is no abdominal tenderness. There is no guarding.  ?Musculoskeletal:     ?   General: Normal range of motion.  ?   Cervical back: Normal range of motion and neck supple.  ?Lymphadenopathy:  ?   Cervical: No cervical adenopathy.  ?Skin: ?   General: Skin is warm and dry.  ?   Findings: No rash.  ?Neurological:  ?   General: No focal deficit present.  ?   Mental Status: She is alert and oriented to person, place, and time.  ?   Cranial Nerves: No cranial nerve deficit.  ?   Coordination: Coordination normal.  ?Psychiatric:     ?   Mood and Affect: Mood normal.     ?   Behavior: Behavior normal.  ? ? ?------------------------------------------------------------------------------------------------------------------------------------------------------------------------------------------------------------------- ?Assessment and Plan ? ?Well adult exam ?Well adult ?Orders Placed This Encounter  ?Procedures  ? DG Bone Density  ?  Standing Status:   Future  ?  Standing Expiration Date:   10/29/2022  ?  Order Specific Question:   Reason for Exam (SYMPTOM  OR DIAGNOSIS REQUIRED)  ?  Answer:   history of osteopenia, chronic steroid use.  ?  Order Specific Question:   Preferred imaging location?  ?  Answer:   12/29/2022  ? COMPLETE METABOLIC PANEL WITH GFR  ? CBC with Differential  ? Lipid Panel w/reflex Direct LDL  ? Hepatitis C Antibody  ? Urine Microalbumin w/creat. ratio  ? TSH  ? Vitamin D (25 hydroxy)  ? Iron, TIBC and Ferritin Panel  ?Screenings: We will go ahead and order  DEXA scan due to her chronic topical steroid use as well as strong family history of osteoporosis. ?Immunizations: Up-to-date ?Anticipatory guidance/risk factor reduction: Recommendations per AVS. ? ? ?No orders of the defined types were placed in this encounter. ? ? ?No follow-ups on file. ? ? ? ?This visit occurred during the SARS-CoV-2 public health emergency.  Safety protocols were in place, including screening questions prior to the visit, additional usage of staff PPE, and extensive cleaning of exam room while observing appropriate contact time as indicated for disinfecting solutions.  ? ?

## 2021-10-29 LAB — COMPLETE METABOLIC PANEL WITH GFR
AG Ratio: 1.6 (calc) (ref 1.0–2.5)
ALT: 11 U/L (ref 6–29)
AST: 15 U/L (ref 10–35)
Albumin: 4.1 g/dL (ref 3.6–5.1)
Alkaline phosphatase (APISO): 60 U/L (ref 37–153)
BUN: 16 mg/dL (ref 7–25)
CO2: 26 mmol/L (ref 20–32)
Calcium: 9.8 mg/dL (ref 8.6–10.4)
Chloride: 107 mmol/L (ref 98–110)
Creat: 0.76 mg/dL (ref 0.50–1.05)
Globulin: 2.5 g/dL (calc) (ref 1.9–3.7)
Glucose, Bld: 127 mg/dL (ref 65–139)
Potassium: 3.9 mmol/L (ref 3.5–5.3)
Sodium: 141 mmol/L (ref 135–146)
Total Bilirubin: 0.7 mg/dL (ref 0.2–1.2)
Total Protein: 6.6 g/dL (ref 6.1–8.1)
eGFR: 88 mL/min/{1.73_m2} (ref >=60)

## 2021-10-29 LAB — CBC WITH DIFFERENTIAL/PLATELET
Absolute Monocytes: 310 cells/uL (ref 200–950)
Basophils Absolute: 30 cells/uL (ref 0–200)
Basophils Relative: 0.7 %
Eosinophils Absolute: 120 cells/uL (ref 15–500)
Eosinophils Relative: 2.8 %
HCT: 38.9 % (ref 35.0–45.0)
Hemoglobin: 13.2 g/dL (ref 11.7–15.5)
Lymphs Abs: 1518 cells/uL (ref 850–3900)
MCH: 30.7 pg (ref 27.0–33.0)
MCHC: 33.9 g/dL (ref 32.0–36.0)
MCV: 90.5 fL (ref 80.0–100.0)
MPV: 12.1 fL (ref 7.5–12.5)
Monocytes Relative: 7.2 %
Neutro Abs: 2322 cells/uL (ref 1500–7800)
Neutrophils Relative %: 54 %
Platelets: 185 10*3/uL (ref 140–400)
RBC: 4.3 10*6/uL (ref 3.80–5.10)
RDW: 12.3 % (ref 11.0–15.0)
Total Lymphocyte: 35.3 %
WBC: 4.3 10*3/uL (ref 3.8–10.8)

## 2021-10-29 LAB — MICROALBUMIN / CREATININE URINE RATIO
Creatinine, Urine: 167 mg/dL (ref 20–275)
Microalb Creat Ratio: 5 mcg/mg creat (ref <30)
Microalb, Ur: 0.9 mg/dL

## 2021-10-29 LAB — HEPATITIS C ANTIBODY
Hepatitis C Ab: NONREACTIVE
SIGNAL TO CUT-OFF: 0.02 (ref <1.00)

## 2021-10-29 LAB — LIPID PANEL W/REFLEX DIRECT LDL
Cholesterol: 140 mg/dL (ref <200)
HDL: 57 mg/dL (ref >=50)
LDL Cholesterol (Calc): 70 mg/dL (calc)
Non-HDL Cholesterol (Calc): 83 mg/dL (calc) (ref <130)
Total CHOL/HDL Ratio: 2.5 (calc) (ref <5.0)
Triglycerides: 58 mg/dL (ref <150)

## 2021-10-29 LAB — IRON,TIBC AND FERRITIN PANEL
%SAT: 29 % (calc) (ref 16–45)
Ferritin: 62 ng/mL (ref 16–288)
Iron: 90 ug/dL (ref 45–160)
TIBC: 314 mcg/dL (calc) (ref 250–450)

## 2021-10-29 LAB — TSH: TSH: 1.28 mIU/L (ref 0.40–4.50)

## 2021-11-05 ENCOUNTER — Other Ambulatory Visit: Payer: Self-pay

## 2021-11-05 MED ORDER — MELOXICAM 15 MG PO TABS: 15.0000 mg | ORAL_TABLET | Freq: Every day | ORAL | 0 refills | Status: DC | PRN

## 2021-11-05 NOTE — Progress Notes (Signed)
Completed.

## 2021-11-07 ENCOUNTER — Other Ambulatory Visit: Payer: Self-pay | Admitting: Family Medicine

## 2021-11-11 ENCOUNTER — Encounter: Payer: Self-pay | Admitting: Family Medicine

## 2022-02-16 ENCOUNTER — Other Ambulatory Visit: Payer: Self-pay | Admitting: Family Medicine

## 2022-02-16 DIAGNOSIS — E039 Hypothyroidism, unspecified: Secondary | ICD-10-CM

## 2022-03-23 ENCOUNTER — Other Ambulatory Visit: Payer: Self-pay

## 2022-03-23 MED ORDER — SEMAGLUTIDE (2 MG/DOSE) 8 MG/3ML ~~LOC~~ SOPN
2.0000 mg | PEN_INJECTOR | SUBCUTANEOUS | 1 refills | Status: DC
Start: 2022-03-23 — End: 2022-04-06

## 2022-04-06 ENCOUNTER — Other Ambulatory Visit: Payer: Self-pay

## 2022-04-06 MED ORDER — SEMAGLUTIDE (2 MG/DOSE) 8 MG/3ML ~~LOC~~ SOPN: 2.0000 mg | PEN_INJECTOR | SUBCUTANEOUS | 1 refills | Status: DC

## 2022-04-15 ENCOUNTER — Telehealth: Payer: Self-pay

## 2022-04-15 NOTE — Telephone Encounter (Signed)
Patient left a vm msg stating that the Ozempic rx will require an [place]. Thanks in advance.

## 2022-04-22 NOTE — Telephone Encounter (Signed)
Patient called back to check the status of the PA.

## 2022-04-30 ENCOUNTER — Telehealth: Payer: Self-pay

## 2022-04-30 NOTE — Telephone Encounter (Signed)
Initiated Prior authorization MVH:QIONGEX (2 MG/DOSE) 8MG /3ML pen-injectors Via: Covermymeds Case/Key:[id number] Status: approved  as of 03/30/22 Reason:An active PA is already on file with expiration date of 10/12/2022 Notified Pt via: pt does not have Mychart called pt to inform of approval

## 2022-06-08 ENCOUNTER — Telehealth: Payer: Self-pay

## 2022-06-08 NOTE — Telephone Encounter (Signed)
Pt lvm requesting labs and possible need for appt with Dr. Zigmund Daniel.   Pls contact the patient and schedule both labs and appt with Dr. Zigmund Daniel for [place]. Thanks

## 2022-06-09 NOTE — Telephone Encounter (Signed)
Scheduled both..labs 10/25 and appointment 06/23/2022. Tvt

## 2022-06-16 ENCOUNTER — Other Ambulatory Visit: Payer: Self-pay | Admitting: Family Medicine

## 2022-06-16 ENCOUNTER — Other Ambulatory Visit: Payer: Managed Care, Other (non HMO)

## 2022-06-17 LAB — COMPLETE METABOLIC PANEL WITH GFR
AG Ratio: 1.5 (calc) (ref 1.0–2.5)
ALT: 12 U/L (ref 6–29)
AST: 14 U/L (ref 10–35)
Albumin: 4.2 g/dL (ref 3.6–5.1)
Alkaline phosphatase (APISO): 79 U/L (ref 37–153)
BUN: 13 mg/dL (ref 7–25)
CO2: 29 mmol/L (ref 20–32)
Calcium: 9.7 mg/dL (ref 8.6–10.4)
Chloride: 105 mmol/L (ref 98–110)
Creat: 0.74 mg/dL (ref 0.50–1.05)
Globulin: 2.8 g/dL (calc) (ref 1.9–3.7)
Glucose, Bld: 100 mg/dL — ABNORMAL HIGH (ref 65–99)
Potassium: 4.5 mmol/L (ref 3.5–5.3)
Sodium: 141 mmol/L (ref 135–146)
Total Bilirubin: 0.5 mg/dL (ref 0.2–1.2)
Total Protein: 7 g/dL (ref 6.1–8.1)
eGFR: 91 mL/min/{1.73_m2} (ref >=60)

## 2022-06-17 LAB — MICROALBUMIN / CREATININE URINE RATIO
Creatinine, Urine: 109 mg/dL (ref 20–275)
Microalb Creat Ratio: 14 mcg/mg creat (ref <30)
Microalb, Ur: 1.5 mg/dL

## 2022-06-17 LAB — LIPID PANEL W/REFLEX DIRECT LDL
Cholesterol: 250 mg/dL — ABNORMAL HIGH (ref <200)
HDL: 60 mg/dL (ref >=50)
LDL Cholesterol (Calc): 156 mg/dL (calc) — ABNORMAL HIGH
Non-HDL Cholesterol (Calc): 190 mg/dL (calc) — ABNORMAL HIGH (ref <130)
Total CHOL/HDL Ratio: 4.2 (calc) (ref <5.0)
Triglycerides: 188 mg/dL — ABNORMAL HIGH (ref <150)

## 2022-06-17 LAB — CBC WITH DIFFERENTIAL/PLATELET
Absolute Monocytes: 324 cells/uL (ref 200–950)
Basophils Absolute: 40 cells/uL (ref 0–200)
Basophils Relative: 1 %
Eosinophils Absolute: 112 cells/uL (ref 15–500)
Eosinophils Relative: 2.8 %
HCT: 39.3 % (ref 35.0–45.0)
Hemoglobin: 13.6 g/dL (ref 11.7–15.5)
Lymphs Abs: 1568 cells/uL (ref 850–3900)
MCH: 30.9 pg (ref 27.0–33.0)
MCHC: 34.6 g/dL (ref 32.0–36.0)
MCV: 89.3 fL (ref 80.0–100.0)
MPV: 11.7 fL (ref 7.5–12.5)
Monocytes Relative: 8.1 %
Neutro Abs: 1956 cells/uL (ref 1500–7800)
Neutrophils Relative %: 48.9 %
Platelets: 201 10*3/uL (ref 140–400)
RBC: 4.4 10*6/uL (ref 3.80–5.10)
RDW: 12 % (ref 11.0–15.0)
Total Lymphocyte: 39.2 %
WBC: 4 10*3/uL (ref 3.8–10.8)

## 2022-06-17 LAB — TSH: TSH: 4.72 mIU/L — ABNORMAL HIGH (ref 0.40–4.50)

## 2022-06-17 LAB — HEPATITIS C ANTIBODY: Hepatitis C Ab: NONREACTIVE

## 2022-06-17 LAB — VITAMIN D 25 HYDROXY (VIT D DEFICIENCY, FRACTURES): Vit D, 25-Hydroxy: 31 ng/mL (ref 30–100)

## 2022-06-17 LAB — IRON,TIBC AND FERRITIN PANEL
%SAT: 40 % (calc) (ref 16–45)
Ferritin: 66 ng/mL (ref 16–288)
Iron: 135 ug/dL (ref 45–160)
TIBC: 338 mcg/dL (calc) (ref 250–450)

## 2022-06-23 ENCOUNTER — Ambulatory Visit: Payer: Managed Care, Other (non HMO) | Admitting: Family Medicine

## 2022-06-24 ENCOUNTER — Ambulatory Visit: Payer: Managed Care, Other (non HMO) | Admitting: Family Medicine

## 2022-06-30 ENCOUNTER — Ambulatory Visit: Payer: Managed Care, Other (non HMO) | Admitting: Family Medicine

## 2022-06-30 ENCOUNTER — Other Ambulatory Visit: Payer: Self-pay | Admitting: Family Medicine

## 2022-06-30 ENCOUNTER — Encounter: Payer: Self-pay | Admitting: Family Medicine

## 2022-06-30 VITALS — BP 101/66 | HR 72 | Ht 65.0 in | Wt 177.0 lb

## 2022-06-30 DIAGNOSIS — E039 Hypothyroidism, unspecified: Secondary | ICD-10-CM | POA: Diagnosis not present

## 2022-06-30 DIAGNOSIS — E785 Hyperlipidemia, unspecified: Secondary | ICD-10-CM | POA: Diagnosis not present

## 2022-06-30 DIAGNOSIS — E119 Type 2 diabetes mellitus without complications: Secondary | ICD-10-CM

## 2022-06-30 LAB — POCT GLYCOSYLATED HEMOGLOBIN (HGB A1C): HbA1c, POC (prediabetic range): 5.6 % — AB (ref 5.7–6.4)

## 2022-06-30 MED ORDER — TIRZEPATIDE 12.5 MG/0.5ML ~~LOC~~ SOAJ: 12.5000 mg | SUBCUTANEOUS | 1 refills | Status: DC

## 2022-06-30 MED ORDER — LEVOTHYROXINE SODIUM 88 MCG PO TABS: 88.0000 ug | ORAL_TABLET | Freq: Every day | ORAL | 1 refills | Status: DC

## 2022-06-30 MED ORDER — TIRZEPATIDE 10 MG/0.5ML ~~LOC~~ SOAJ: 10.0000 mg | SUBCUTANEOUS | 0 refills | Status: DC

## 2022-06-30 NOTE — Progress Notes (Signed)
Roberta Hayes - 66 y.o. female MRN [id number]  Date of birth: 12-23-1955  Subjective Chief Complaint  Patient presents with   Diabetes   Weight Loss    HPI Roberta Hayes is a 66 year old female here today for follow-up.  She reports she is approaching retirement center and was excited about this.  She plans to spend time traveling when she retires.  She reports that she feels pretty well.  She has gotten off track recently.  Weight is up some.  She does not feel that Ozempic is helping with appetite suppression as much.  Blood sugars remains well controlled with this.  She continues to tolerate her current medications well.    Her cholesterol is elevated on recent labs.  She was taking Livalo due to intolerance to previous statins but is not currently taking.  Her TSH was elevated recently on her labs.  She is taking her levothyroxine as directed each day.  ROS:  A comprehensive ROS was completed and negative except as noted per HPI  No Known Allergies  Past Medical History:  Diagnosis Date   Diabetes mellitus without complication ([place])    Thyroid disease     Past Surgical History:  Procedure Laterality Date   ABDOMINAL HYSTERECTOMY     CHOLECYSTECTOMY     KNEE ARTHROSCOPY Left    TOTAL ABDOMINAL HYSTERECTOMY      Social History   Socioeconomic History   Marital status: Married    Spouse name: Not on file   Number of children: Not on file   Years of education: Not on file   Highest education level: Not on file  Occupational History   Not on file  Tobacco Use   Smoking status: Never   Smokeless tobacco: Never  Substance and Sexual Activity   Alcohol use: Yes    Comment: rarely   Drug use: Never   Sexual activity: Not on file  Other Topics Concern   Not on file  Social History Narrative   Not on file   Social Determinants of Health   Financial Resource Strain: Not on file  Food Insecurity: Not on file  Transportation Needs: Not on file  Physical  Activity: Not on file  Stress: Not on file  Social Connections: Not on file    History reviewed. No pertinent family history.  Health Maintenance  Topic Date Due   HIV Screening  Never done   COVID-19 Vaccine (4 - Moderna risk series) 11/04/2020   INFLUENZA VACCINE  03/23/2022   OPHTHALMOLOGY EXAM  04/01/2022   FOOT EXAM  10/07/2022   HEMOGLOBIN A1C  12/29/2022   Diabetic kidney evaluation - GFR measurement  06/17/2023   Diabetic kidney evaluation - Urine ACR  06/17/2023   MAMMOGRAM  10/13/2023   COLONOSCOPY (Pts 45-32yrs Insurance coverage will need to be confirmed)  04/18/2025   TETANUS/TDAP  03/14/2031   Hepatitis C Screening  Completed   Zoster Vaccines- Shingrix  Completed   HPV VACCINES  Aged Out   PAP SMEAR-Modifier  Discontinued     ----------------------------------------------------------------------------------------------------------------------------------------------------------------------------------------------------------------- Physical Exam BP 101/66 (BP Location: Right Arm, Patient Position: Sitting, Cuff Size: Normal)   Pulse 72   Ht 5\' 5"  (1.651 m)   Wt 177 lb (80.3 kg)   SpO2 98%   BMI 29.45 kg/m   Physical Exam Constitutional:      Appearance: Normal appearance.  HENT:     Head: Normocephalic and atraumatic.  Cardiovascular:     Rate and Rhythm: Normal rate and regular  rhythm.  Pulmonary:     Effort: Pulmonary effort is normal.     Breath sounds: Normal breath sounds.  Neurological:     Mental Status: She is alert.  Psychiatric:        Mood and Affect: Mood normal.        Behavior: Behavior normal.     ------------------------------------------------------------------------------------------------------------------------------------------------------------------------------------------------------------------- Assessment and Plan  Type 2 diabetes mellitus without complication, without long-term current use of insulin (HCC) A1c  remains fairly stable.  She is regaining some of her weight.  We will try switching to [facility] to see if this is more effective to keep her blood sugars and weight under better control.  Acquired hypothyroidism TSH is elevated.  Increasing levothyroxine to 88 mcg daily.  HLD (hyperlipidemia) Cholesterol is elevated.  Recommend working on dietary change with incorporation of moderate exercise.     Meds ordered this encounter  Medications   tirzepatide (MOUNJARO) 10 MG/0.5ML Pen    Sig: Inject 10 mg into the skin once a week. Take 10mg  x1 month then increase to 12.5mg .    Dispense:  3 mL    Refill:  0   tirzepatide (MOUNJARO) 12.5 MG/0.5ML Pen    Sig: Inject 12.5 mg into the skin once a week.    Dispense:  6 mL    Refill:  1   levothyroxine (SYNTHROID) 88 MCG tablet    Sig: Take 1 tablet (88 mcg total) by mouth daily before breakfast.    Dispense:  90 tablet    Refill:  1    Return in about 3 months (around 09/30/2022) for T2DM.    This visit occurred during the SARS-CoV-2 public health emergency.  Safety protocols were in place, including screening questions prior to the visit, additional usage of staff PPE, and extensive cleaning of exam room while observing appropriate contact time as indicated for disinfecting solutions.

## 2022-06-30 NOTE — Assessment & Plan Note (Signed)
TSH is elevated.  Increasing levothyroxine to 88 mcg daily.

## 2022-06-30 NOTE — Patient Instructions (Signed)
Let's try change to mounjaro  We'll start at 10mg  for 1 month and then increase to 12.5mg  each week.   Increase levothyroxine to .  See me again in 3-4 months.

## 2022-06-30 NOTE — Assessment & Plan Note (Signed)
A1c remains fairly stable.  She is regaining some of her weight.  We will try switching to [facility] to see if this is more effective to keep her blood sugars and weight under better control.

## 2022-06-30 NOTE — Assessment & Plan Note (Signed)
Cholesterol is elevated.  Recommend working on dietary change with incorporation of moderate exercise.

## 2022-07-01 ENCOUNTER — Telehealth: Payer: Self-pay

## 2022-07-01 NOTE — Telephone Encounter (Signed)
Mounjaro approved from 06/30/22 - 06/30/23.

## 2022-09-15 ENCOUNTER — Other Ambulatory Visit: Payer: Self-pay

## 2022-09-15 DIAGNOSIS — M1712 Unilateral primary osteoarthritis, left knee: Secondary | ICD-10-CM

## 2022-09-15 DIAGNOSIS — E039 Hypothyroidism, unspecified: Secondary | ICD-10-CM

## 2022-09-15 MED ORDER — LEVOTHYROXINE SODIUM 88 MCG PO TABS: 88.0000 ug | ORAL_TABLET | Freq: Every day | ORAL | 3 refills | Status: DC

## 2022-09-15 MED ORDER — MELOXICAM 15 MG PO TABS: 15.0000 mg | ORAL_TABLET | Freq: Every day | ORAL | 3 refills | Status: DC | PRN

## 2022-09-23 ENCOUNTER — Other Ambulatory Visit (HOSPITAL_BASED_OUTPATIENT_CLINIC_OR_DEPARTMENT_OTHER): Payer: Self-pay

## 2022-09-24 ENCOUNTER — Telehealth: Payer: Self-pay

## 2022-09-24 ENCOUNTER — Other Ambulatory Visit (HOSPITAL_BASED_OUTPATIENT_CLINIC_OR_DEPARTMENT_OTHER): Payer: Self-pay

## 2022-09-24 MED ORDER — MOUNJARO 12.5 MG/0.5ML ~~LOC~~ SOAJ
12.5000 mg | SUBCUTANEOUS | 0 refills | Status: DC
  Filled 2022-09-24: qty 2, 28d supply, fill #0

## 2022-09-24 NOTE — Telephone Encounter (Signed)
Pt's prescription Rx information for [facility] ID# [id number]

## 2022-09-30 ENCOUNTER — Ambulatory Visit: Payer: Managed Care, Other (non HMO) | Admitting: Family Medicine

## 2022-10-11 LAB — HM MAMMOGRAPHY

## 2022-10-13 ENCOUNTER — Ambulatory Visit: Payer: Managed Care, Other (non HMO) | Admitting: Family Medicine

## 2022-10-13 ENCOUNTER — Encounter: Payer: Self-pay | Admitting: Family Medicine

## 2022-10-13 VITALS — BP 118/74 | HR 79 | Ht 65.0 in | Wt 145.1 lb

## 2022-10-13 DIAGNOSIS — E119 Type 2 diabetes mellitus without complications: Secondary | ICD-10-CM

## 2022-10-13 DIAGNOSIS — E785 Hyperlipidemia, unspecified: Secondary | ICD-10-CM | POA: Diagnosis not present

## 2022-10-13 DIAGNOSIS — E039 Hypothyroidism, unspecified: Secondary | ICD-10-CM | POA: Diagnosis not present

## 2022-10-13 LAB — POCT GLYCOSYLATED HEMOGLOBIN (HGB A1C): HbA1c, POC (prediabetic range): 5.6 % — AB (ref 5.7–6.4)

## 2022-10-13 MED ORDER — TIRZEPATIDE 12.5 MG/0.5ML ~~LOC~~ SOAJ: 12.5000 mg | SUBCUTANEOUS | 1 refills | Status: DC

## 2022-10-13 MED ORDER — TIRZEPATIDE 12.5 MG/0.5ML ~~LOC~~ SOAJ
12.5000 mg | SUBCUTANEOUS | 1 refills | Status: DC
  Filled 2022-10-19: qty 6, 84d supply, fill #0
  Filled 2023-01-06 – 2023-01-10 (×2): qty 6, 84d supply, fill #1

## 2022-10-13 NOTE — Assessment & Plan Note (Signed)
Tolerating Livalo well at current strength.  Recommend continuation of this at current strength.

## 2022-10-13 NOTE — Progress Notes (Signed)
Roberta Hayes - 67 y.o. female MRN [id number]  Date of birth: 19-Jul-1956  Subjective Chief Complaint  Patient presents with   Diabetes    HPI Roberta Hayes is a 67 year old female here today for follow-up visit.  She reports she is doing quite well.  She is planning on retiring next week.  She has a transatlantic cruise to celebrate her retirement.  She continues on [place] for management of her diabetes.  She has done quite well with this and blood sugars have been very well-controlled.  She has been more diligent about her diet and exercise.  Weight is down about 30 pounds since November.  She denies any side effects with current medications.  Remains on Livalo for management of hyperlipidemia.  She is tolerating this well.  She has not had side effects from this, as she did have side effects from other statins in the past.  Feels well with current dose of levothyroxine.  ROS:  A comprehensive ROS was completed and negative except as noted per HPI  No Known Allergies  Past Medical History:  Diagnosis Date   Diabetes mellitus without complication ([place])    Thyroid disease     Past Surgical History:  Procedure Laterality Date   ABDOMINAL HYSTERECTOMY     CHOLECYSTECTOMY     KNEE ARTHROSCOPY Left    TOTAL ABDOMINAL HYSTERECTOMY      Social History   Socioeconomic History   Marital status: Married    Spouse name: Not on file   Number of children: Not on file   Years of education: Not on file   Highest education level: Not on file  Occupational History   Not on file  Tobacco Use   Smoking status: Never   Smokeless tobacco: Never  Substance and Sexual Activity   Alcohol use: Yes    Comment: rarely   Drug use: Never   Sexual activity: Not on file  Other Topics Concern   Not on file  Social History Narrative   Not on file   Social Determinants of Health   Financial Resource Strain: Not on file  Food Insecurity: Not on file  Transportation Needs: Not  on file  Physical Activity: Not on file  Stress: Not on file  Social Connections: Not on file    History reviewed. No pertinent family history.  Health Maintenance  Topic Date Due   HIV Screening  Never done   OPHTHALMOLOGY EXAM  04/01/2022   COVID-19 Vaccine (4 - 2023-24 season) 04/23/2022   HEMOGLOBIN A1C  04/13/2023   Diabetic kidney evaluation - eGFR measurement  06/17/2023   Diabetic kidney evaluation - Urine ACR  06/17/2023   MAMMOGRAM  10/13/2023   FOOT EXAM  10/14/2023   COLONOSCOPY (Pts 45-37yr Insurance coverage will need to be confirmed)  04/18/2025   DTaP/Tdap/Td (2 - Td or Tdap) 03/14/2031   INFLUENZA VACCINE  Completed   Hepatitis C Screening  Completed   Zoster Vaccines- Shingrix  Completed   HPV VACCINES  Aged Out   PAP SMEAR-Modifier  Discontinued     ----------------------------------------------------------------------------------------------------------------------------------------------------------------------------------------------------------------- Physical Exam BP 118/74 (BP Location: Left Arm, Patient Position: Sitting, Cuff Size: Normal)   Pulse 79   Ht 5' 5"$  (1.651 m)   Wt 145 lb 1.3 oz (65.8 kg)   SpO2 100%   BMI 24.14 kg/m   Physical Exam Constitutional:      Appearance: Normal appearance.  HENT:     Head: Normocephalic and atraumatic.  Eyes:  General: No scleral icterus. Cardiovascular:     Rate and Rhythm: Normal rate and regular rhythm.  Pulmonary:     Effort: Pulmonary effort is normal.     Breath sounds: Normal breath sounds.  Neurological:     Mental Status: She is alert.  Psychiatric:        Mood and Affect: Mood normal.        Behavior: Behavior normal.     ------------------------------------------------------------------------------------------------------------------------------------------------------------------------------------------------------------------- Assessment and Plan  Type 2 diabetes mellitus  without complication, without long-term current use of insulin ([place]) Diabetes remains well-controlled.  She will continue Mounjaro Xigduo at current strength.  Doing well with diet and exercise.  Acquired hypothyroidism She has done well with current dose levothyroxine.  Will plan to continue this at current strength.  HLD (hyperlipidemia) Tolerating Livalo well at current strength.  Recommend continuation of this at current strength.   Meds ordered this encounter  Medications   DISCONTD: tirzepatide (MOUNJARO) 12.5 MG/0.5ML Pen    Sig: Inject 12.5 mg into the skin once a week.    Dispense:  6 mL    Refill:  1   tirzepatide (MOUNJARO) 12.5 MG/0.5ML Pen    Sig: Inject 12.5 mg into the skin once a week.    Dispense:  6 mL    Refill:  1    Return in about 6 months (around 04/13/2023) for F/u T2DM/Lipids/Thyroid.    This visit occurred during the SARS-CoV-2 public health emergency.  Safety protocols were in place, including screening questions prior to the visit, additional usage of staff PPE, and extensive cleaning of exam room while observing appropriate contact time as indicated for disinfecting solutions.

## 2022-10-13 NOTE — Assessment & Plan Note (Signed)
Diabetes remains well-controlled.  She will continue Mounjaro Xigduo at current strength.  Doing well with diet and exercise.

## 2022-10-13 NOTE — Assessment & Plan Note (Signed)
She has done well with current dose levothyroxine.  Will plan to continue this at current strength.

## 2022-10-19 ENCOUNTER — Other Ambulatory Visit (HOSPITAL_BASED_OUTPATIENT_CLINIC_OR_DEPARTMENT_OTHER): Payer: Self-pay

## 2022-11-02 ENCOUNTER — Encounter: Payer: Self-pay | Admitting: Family Medicine

## 2023-01-03 ENCOUNTER — Telehealth: Payer: Self-pay | Admitting: Family Medicine

## 2023-01-03 DIAGNOSIS — E039 Hypothyroidism, unspecified: Secondary | ICD-10-CM

## 2023-01-03 MED ORDER — LEVOTHYROXINE SODIUM 88 MCG PO TABS: 88.0000 ug | ORAL_TABLET | Freq: Every day | ORAL | 3 refills | Status: DC

## 2023-01-03 NOTE — Telephone Encounter (Signed)
Patient called requesting a refill of ;  Levothyroxine 88 MCG tablet Patient went out of town and left her medicine she is out  Pharmacy ;  [facility] on [address]                      [place]

## 2023-01-06 ENCOUNTER — Other Ambulatory Visit (HOSPITAL_BASED_OUTPATIENT_CLINIC_OR_DEPARTMENT_OTHER): Payer: Self-pay

## 2023-01-07 ENCOUNTER — Other Ambulatory Visit: Payer: Self-pay

## 2023-01-07 ENCOUNTER — Other Ambulatory Visit (HOSPITAL_BASED_OUTPATIENT_CLINIC_OR_DEPARTMENT_OTHER): Payer: Self-pay

## 2023-01-07 DIAGNOSIS — E119 Type 2 diabetes mellitus without complications: Secondary | ICD-10-CM

## 2023-01-07 MED ORDER — XIGDUO XR 5-1000 MG PO TB24: 1.0000 | ORAL_TABLET | Freq: Every day | ORAL | 1 refills | Status: DC

## 2023-01-10 ENCOUNTER — Other Ambulatory Visit (HOSPITAL_BASED_OUTPATIENT_CLINIC_OR_DEPARTMENT_OTHER): Payer: Self-pay

## 2023-04-01 ENCOUNTER — Other Ambulatory Visit (HOSPITAL_BASED_OUTPATIENT_CLINIC_OR_DEPARTMENT_OTHER): Payer: Self-pay

## 2023-04-01 ENCOUNTER — Other Ambulatory Visit: Payer: Self-pay | Admitting: Family Medicine

## 2023-04-01 MED ORDER — MOUNJARO 12.5 MG/0.5ML ~~LOC~~ SOAJ
12.5000 mg | SUBCUTANEOUS | 1 refills | Status: DC
  Filled 2023-04-01: qty 6, 84d supply, fill #0
  Filled 2023-04-04: qty 2, 28d supply, fill #0
  Filled 2023-04-28: qty 2, 28d supply, fill #1
  Filled 2023-05-24: qty 6, 84d supply, fill #2

## 2023-04-04 ENCOUNTER — Other Ambulatory Visit (HOSPITAL_BASED_OUTPATIENT_CLINIC_OR_DEPARTMENT_OTHER): Payer: Self-pay

## 2023-04-29 ENCOUNTER — Other Ambulatory Visit (HOSPITAL_BASED_OUTPATIENT_CLINIC_OR_DEPARTMENT_OTHER): Payer: Self-pay

## 2023-05-24 ENCOUNTER — Other Ambulatory Visit (HOSPITAL_BASED_OUTPATIENT_CLINIC_OR_DEPARTMENT_OTHER): Payer: Self-pay

## 2023-07-08 ENCOUNTER — Other Ambulatory Visit: Payer: Self-pay | Admitting: Family Medicine

## 2023-07-08 DIAGNOSIS — E119 Type 2 diabetes mellitus without complications: Secondary | ICD-10-CM

## 2023-07-08 NOTE — Telephone Encounter (Signed)
Patient scheduled for 07/18/23, thanks.

## 2023-07-08 NOTE — Telephone Encounter (Signed)
Pls contact the patient to schedule DM appt for Dr. Ashley Royalty. Sending 30 day refill. Thx

## 2023-07-18 ENCOUNTER — Encounter: Payer: Self-pay | Admitting: Family Medicine

## 2023-07-18 ENCOUNTER — Ambulatory Visit: Payer: Managed Care, Other (non HMO) | Admitting: Family Medicine

## 2023-07-18 DIAGNOSIS — E039 Hypothyroidism, unspecified: Secondary | ICD-10-CM

## 2023-07-18 DIAGNOSIS — Z7984 Long term (current) use of oral hypoglycemic drugs: Secondary | ICD-10-CM

## 2023-07-18 DIAGNOSIS — E785 Hyperlipidemia, unspecified: Secondary | ICD-10-CM | POA: Diagnosis not present

## 2023-07-18 DIAGNOSIS — E119 Type 2 diabetes mellitus without complications: Secondary | ICD-10-CM

## 2023-07-18 LAB — POCT GLYCOSYLATED HEMOGLOBIN (HGB A1C): HbA1c, POC (controlled diabetic range): 5.2 % (ref 0.0–7.0)

## 2023-07-18 MED ORDER — LEVOTHYROXINE SODIUM 88 MCG PO TABS: 88.0000 ug | ORAL_TABLET | Freq: Every day | ORAL | 3 refills | Status: DC

## 2023-07-18 MED ORDER — PITAVASTATIN CALCIUM 2 MG PO TABS: ORAL_TABLET | ORAL | 3 refills | Status: DC

## 2023-07-18 MED ORDER — MOUNJARO 12.5 MG/0.5ML ~~LOC~~ SOAJ: 12.5000 mg | SUBCUTANEOUS | 3 refills | Status: DC

## 2023-07-18 NOTE — Assessment & Plan Note (Signed)
She has done well with current dose levothyroxine.  Will plan to continue this at current strength. Update TSH

## 2023-07-18 NOTE — Progress Notes (Signed)
Roberta Hayes - 67 y.o. female MRN [id number]  Date of birth: 11/04/1955  Subjective Chief Complaint  Patient presents with   Diabetes    HPI Roberta Hayes is a 67 y.o. female here today for follow up.   She reports that she is doing quite well.  She had liposuction and tummy tuck since last visit.    Remains on combination of xigduo and mounjaro for management of diabetes.  Overall she has done quite well with these. She has not had significant side effects.  She has been able to maintain weight loss.  She is tolerating pitavastatin well at current strength.    Feels pretty good with current strength of levothyroxine.    ROS:  A comprehensive ROS was completed and negative except as noted per HPI  No Known Allergies  Past Medical History:  Diagnosis Date   Diabetes mellitus without complication ([facility])    Thyroid disease     Past Surgical History:  Procedure Laterality Date   ABDOMINAL HYSTERECTOMY     CHOLECYSTECTOMY     KNEE ARTHROSCOPY Left    TOTAL ABDOMINAL HYSTERECTOMY      Social History   Socioeconomic History   Marital status: Married    Spouse name: Not on file   Number of children: Not on file   Years of education: Not on file   Highest education level: Not on file  Occupational History   Not on file  Tobacco Use   Smoking status: Never   Smokeless tobacco: Never  Substance and Sexual Activity   Alcohol use: Yes    Comment: rarely   Drug use: Never   Sexual activity: Not on file  Other Topics Concern   Not on file  Social History Narrative   Not on file   Social Determinants of Health   Financial Resource Strain: Not on file  Food Insecurity: No Food Insecurity (09/21/2021)   Received from [facility], [facility]   Hunger Vital Sign    Worried About Running Out of Food in the Last Year: Never true    Ran Out of Food in the Last Year: Never true  Transportation Needs: Not on file  Physical Activity: Not on file  Stress: Not on file   Social Connections: Unknown (01/03/2022)   Received from [facility], [facility]   Social Network    Social Network: Not on file    No family history on file.  Health Maintenance  Topic Date Due   HIV Screening  Never done   OPHTHALMOLOGY EXAM  04/01/2022   INFLUENZA VACCINE  03/24/2023   HEMOGLOBIN A1C  04/13/2023   COVID-19 Vaccine (4 - 2023-24 season) 04/24/2023   Diabetic kidney evaluation - eGFR measurement  06/17/2023   Diabetic kidney evaluation - Urine ACR  06/17/2023   MAMMOGRAM  10/12/2023   FOOT EXAM  10/14/2023   Colonoscopy  04/18/2025   DTaP/Tdap/Td (2 - Td or Tdap) 03/14/2031   Hepatitis C Screening  Completed   Zoster Vaccines- Shingrix  Completed   HPV VACCINES  Aged Out     ----------------------------------------------------------------------------------------------------------------------------------------------------------------------------------------------------------------- Physical Exam BP 98/62 (BP Location: Left Arm, Patient Position: Sitting, Cuff Size: Small)   Pulse 73   Ht 5\' 5"  (1.651 m)   Wt 128 lb (58.1 kg)   SpO2 99%   BMI 21.30 kg/m   Physical Exam Constitutional:      Appearance: Normal appearance.  HENT:     Head: Normocephalic and atraumatic.  Eyes:  General: No scleral icterus. Cardiovascular:     Rate and Rhythm: Normal rate and regular rhythm.  Pulmonary:     Effort: Pulmonary effort is normal.     Breath sounds: Normal breath sounds.  Neurological:     Mental Status: She is alert.  Psychiatric:        Mood and Affect: Mood normal.        Behavior: Behavior normal.     ------------------------------------------------------------------------------------------------------------------------------------------------------------------------------------------------------------------- Assessment and Plan  Type 2 diabetes mellitus without complication, without long-term current use of insulin (H[facility]) Diabetes remains  well-controlled.  She will continue Mounjaro.  D/c Xigduo.  Doing well with diet and exercise.  Acquired hypothyroidism She has done well with current dose levothyroxine.  Will plan to continue this at current strength. Update TSH  HLD (hyperlipidemia) Tolerating Livalo well at current strength.  Recommend continuation of this at current strength.   Meds ordered this encounter  Medications   levothyroxine (SYNTHROID) 88 MCG tablet    Sig: Take 1 tablet (88 mcg total) by mouth daily before breakfast.    Dispense:  90 tablet    Refill:  3   Pitavastatin Calcium (LIVALO) 2 MG TABS    Sig: TAKE 1 TABLET(2 MG) BY MOUTH DAILY    Dispense:  90 tablet    Refill:  3   tirzepatide (MOUNJARO) 12.5 MG/0.5ML Pen    Sig: Inject 12.5 mg into the skin once a week.    Dispense:  6 mL    Refill:  3    No follow-ups on file.    This visit occurred during the SARS-CoV-2 public health emergency.  Safety protocols were in place, including screening questions prior to the visit, additional usage of staff PPE, and extensive cleaning of exam room while observing appropriate contact time as indicated for disinfecting solutions.

## 2023-07-18 NOTE — Assessment & Plan Note (Signed)
Tolerating Livalo well at current strength.  Recommend continuation of this at current strength.

## 2023-07-18 NOTE — Assessment & Plan Note (Addendum)
Diabetes remains well-controlled.  She will continue Mounjaro.  D/c Xigduo.  Doing well with diet and exercise.

## 2023-08-05 DIAGNOSIS — E039 Hypothyroidism, unspecified: Secondary | ICD-10-CM | POA: Diagnosis not present

## 2023-08-05 DIAGNOSIS — E119 Type 2 diabetes mellitus without complications: Secondary | ICD-10-CM | POA: Diagnosis not present

## 2023-08-05 DIAGNOSIS — E785 Hyperlipidemia, unspecified: Secondary | ICD-10-CM | POA: Diagnosis not present

## 2023-08-07 LAB — CBC WITH DIFFERENTIAL/PLATELET
Basophils Absolute: 0.1 10*3/uL (ref 0.0–0.2)
Basos: 1 %
EOS (ABSOLUTE): 0.2 10*3/uL (ref 0.0–0.4)
Eos: 5 %
Hematocrit: 40 % (ref 34.0–46.6)
Hemoglobin: 13.5 g/dL (ref 11.1–15.9)
Immature Grans (Abs): 0 10*3/uL (ref 0.0–0.1)
Immature Granulocytes: 0 %
Lymphocytes Absolute: 1.9 10*3/uL (ref 0.7–3.1)
Lymphs: 40 %
MCH: 31 pg (ref 26.6–33.0)
MCHC: 33.8 g/dL (ref 31.5–35.7)
MCV: 92 fL (ref 79–97)
Monocytes Absolute: 0.4 10*3/uL (ref 0.1–0.9)
Monocytes: 8 %
Neutrophils Absolute: 2.1 10*3/uL (ref 1.4–7.0)
Neutrophils: 46 %
Platelets: 220 10*3/uL (ref 150–450)
RBC: 4.36 x10E6/uL (ref 3.77–5.28)
RDW: 12 % (ref 11.7–15.4)
WBC: 4.6 10*3/uL (ref 3.4–10.8)

## 2023-08-07 LAB — CMP14+EGFR
ALT: 9 [IU]/L (ref 0–32)
AST: 15 [IU]/L (ref 0–40)
Albumin: 4.4 g/dL (ref 3.9–4.9)
Alkaline Phosphatase: 64 [IU]/L (ref 44–121)
BUN/Creatinine Ratio: 20 (ref 12–28)
BUN: 17 mg/dL (ref 8–27)
Bilirubin Total: 0.5 mg/dL (ref 0.0–1.2)
CO2: 23 mmol/L (ref 20–29)
Calcium: 10 mg/dL (ref 8.7–10.3)
Chloride: 105 mmol/L (ref 96–106)
Creatinine, Ser: 0.85 mg/dL (ref 0.57–1.00)
Globulin, Total: 2.5 g/dL (ref 1.5–4.5)
Glucose: 95 mg/dL (ref 70–99)
Potassium: 4.5 mmol/L (ref 3.5–5.2)
Sodium: 140 mmol/L (ref 134–144)
Total Protein: 6.9 g/dL (ref 6.0–8.5)
eGFR: 76 mL/min/{1.73_m2} (ref >59)

## 2023-08-07 LAB — MICROALBUMIN / CREATININE URINE RATIO
Creatinine, Urine: 103.4 mg/dL
Microalb/Creat Ratio: 8 mg/g{creat} (ref 0–29)
Microalbumin, Urine: 8.4 ug/mL

## 2023-08-07 LAB — LIPID PANEL WITH LDL/HDL RATIO
Cholesterol, Total: 195 mg/dL (ref 100–199)
HDL: 63 mg/dL (ref >39)
LDL Chol Calc (NIH): 116 mg/dL — ABNORMAL HIGH (ref 0–99)
LDL/HDL Ratio: 1.8 {ratio} (ref 0.0–3.2)
Triglycerides: 88 mg/dL (ref 0–149)
VLDL Cholesterol Cal: 16 mg/dL (ref 5–40)

## 2023-08-07 LAB — TSH: TSH: 1.56 u[IU]/mL (ref 0.450–4.500)

## 2023-08-22 ENCOUNTER — Telehealth: Payer: Self-pay

## 2023-08-22 DIAGNOSIS — E039 Hypothyroidism, unspecified: Secondary | ICD-10-CM

## 2023-08-22 DIAGNOSIS — E785 Hyperlipidemia, unspecified: Secondary | ICD-10-CM

## 2023-08-22 MED ORDER — LEVOTHYROXINE SODIUM 88 MCG PO TABS: 88.0000 ug | ORAL_TABLET | Freq: Every day | ORAL | 3 refills | Status: DC

## 2023-08-22 MED ORDER — PITAVASTATIN CALCIUM 2 MG PO TABS: ORAL_TABLET | ORAL | 3 refills | Status: DC

## 2023-08-22 MED ORDER — MOUNJARO 12.5 MG/0.5ML ~~LOC~~ SOAJ: 12.5000 mg | SUBCUTANEOUS | 3 refills | Status: DC

## 2023-08-22 NOTE — Telephone Encounter (Signed)
Copied from CRM [id number]. Topic: Clinical - Prescription Issue >> Aug 22, 2023  8:58 AM Prudencio Pair wrote: Reason for CRM: Patient states she switched pharmacies & all of her prescription refills were sent to her old pharmacy, [facility]. Pt states she switched to [facility] & she has not been able to get her Mounjaro 12.5 mL/0.5 mL pen. States she is out. Pt states pharmacy is telling her they can't refill it. She wants to know if she can come by & get a paper prescription for it so that she can walk it in to the pharmacy herself & see what is going on. Would like for nurse or pcp to give her a callback in regards to this. CB #: [phone].

## 2023-08-22 NOTE — Telephone Encounter (Signed)
A prescription has been sent to [facility]. Patient is aware.

## 2023-08-23 ENCOUNTER — Other Ambulatory Visit (HOSPITAL_BASED_OUTPATIENT_CLINIC_OR_DEPARTMENT_OTHER): Payer: Self-pay

## 2023-08-23 ENCOUNTER — Telehealth: Payer: Self-pay

## 2023-08-23 ENCOUNTER — Other Ambulatory Visit: Payer: Self-pay

## 2023-08-23 NOTE — Telephone Encounter (Signed)
 Copied from CRM [id number]. Topic: Clinical - Prescription Issue >> Aug 23, 2023  1:00 PM [name] wrote: Reason for CRM: Patient is calling to report that [name] is telling her that the tirzepatide  (MOUNJARO ) 12.5 MG/0.5ML Pen requires prior authorization from Dr. Estil office. Could we call the insurance company to get this issue cleared up?              Forwarding to panya carter, CMA  Dr. Alvia nurse Pt is aware Roberta Hayes, CMA

## 2023-08-23 NOTE — Telephone Encounter (Signed)
 PA initiated on 08/23/2023.

## 2023-08-23 NOTE — Telephone Encounter (Signed)
 Copied from CRM [id number]. Topic: Clinical - Medication Question >> Aug 23, 2023 10:48 AM Leotis ORN wrote: Reason for CRM: PT stated she spoke with doctor regarding her labs, all were normal, she switched pharmacies, after picking up medication, patient is confused why Pitavastatin  Calcium  (LIVALO ) 2 MG TABS was filled if labs were normal.  After checking chart, informed her it was possibly from switching pharmacies resulting in refilling all medication that was previously prescribed, offered to call clinic access line for clarification or schedule call back. Pt denied. I want to make sure that I did not give the incorrect information if pt is needing to take Pitavastatin  Calcium  (LIVALO ) 2 MG TABS. Pt callback [phone]

## 2023-08-23 NOTE — Telephone Encounter (Signed)
 Livalo  is for cholesterol, cholesterol is near normal because of the Livalo , if she stops the medication then the cholesterol will elevate and become uncontrolled again.  There is no cure for high cholesterol.  Looking further into her chart, considering the diabetes her LDL/bad cholesterol needs to be below 100, it is above 100 indicating that we actually need to go up on the Livalo  to 4 mg from 2 mg and then recheck fasting lipids again in 3 months.  Let me know her thoughts.

## 2023-08-25 ENCOUNTER — Ambulatory Visit: Payer: Managed Care, Other (non HMO) | Admitting: Sports Medicine

## 2023-08-25 ENCOUNTER — Ambulatory Visit: Payer: Medicare Other

## 2023-08-25 DIAGNOSIS — M545 Low back pain, unspecified: Secondary | ICD-10-CM

## 2023-08-25 DIAGNOSIS — M5416 Radiculopathy, lumbar region: Secondary | ICD-10-CM | POA: Diagnosis not present

## 2023-08-25 DIAGNOSIS — M47816 Spondylosis without myelopathy or radiculopathy, lumbar region: Secondary | ICD-10-CM | POA: Diagnosis not present

## 2023-08-25 DIAGNOSIS — I700 Atherosclerosis of aorta: Secondary | ICD-10-CM | POA: Diagnosis not present

## 2023-08-25 DIAGNOSIS — M8580 Other specified disorders of bone density and structure, unspecified site: Secondary | ICD-10-CM | POA: Diagnosis not present

## 2023-08-25 MED ORDER — TRAMADOL HCL 50 MG PO TABS: 50.0000 mg | ORAL_TABLET | Freq: Three times a day (TID) | ORAL | 0 refills | Status: DC | PRN

## 2023-08-25 MED ORDER — MOUNJARO 12.5 MG/0.5ML ~~LOC~~ SOAJ: 12.5000 mg | SUBCUTANEOUS | 3 refills | Status: DC

## 2023-08-25 MED ORDER — PREDNISONE 50 MG PO TABS: ORAL_TABLET | ORAL | 0 refills | Status: DC

## 2023-08-25 MED ORDER — MELOXICAM 15 MG PO TABS: ORAL_TABLET | ORAL | 3 refills | Status: AC

## 2023-08-25 NOTE — Telephone Encounter (Signed)
 Mounjaro approved until 08/22/2024.

## 2023-08-25 NOTE — Progress Notes (Signed)
    Procedures performed today:    None.  Independent interpretation of notes and tests performed by another provider:   None.  Brief History, Exam, Impression, and Recommendations:    Left lumbar radiculitis This is a very pleasant 68 year old female, she recalls bowling about a week ago, then had some pain in her back, buttock with radiation down the left leg to the anterior left lower leg but not to the foot. Exam is for the most part unrevealing today. I do suspect left lumbar radiculitis, we will treat her conservatively, prednisone , Mobic , tramadol  as needed for breakthrough pain as she does have a cruise coming up. Formal PT, home conditioning and return to see me in about 5 to 6 weeks. She does understand the evolutionary anthropology of the lumbar spine, and she understands we will be doing an MRI if not better at the follow-up.    ____________________________________________ Debby PARAS. Curtis, M.D., ABFM., CAQSM., AME. Primary Care and Sports Medicine [facility]  Adjunct Professor of [employer] Medicine  [employer]  Restaurant [employer], [employer]

## 2023-08-25 NOTE — Assessment & Plan Note (Signed)
 This is a very pleasant 68 year old female, she recalls bowling about a week ago, then had some pain in her back, buttock with radiation down the left leg to the anterior left lower leg but not to the foot. Exam is for the most part unrevealing today. I do suspect left lumbar radiculitis, we will treat her conservatively, prednisone , Mobic , tramadol  as needed for breakthrough pain as she does have a cruise coming up. Formal PT, home conditioning and return to see me in about 5 to 6 weeks. She does understand the evolutionary anthropology of the lumbar spine, and she understands we will be doing an MRI if not better at the follow-up.

## 2023-08-25 NOTE — Addendum Note (Signed)
 Addended by: Ardyth Man on: 08/25/2023 09:25 AM   Modules accepted: Orders

## 2023-08-26 NOTE — Therapy (Signed)
 OUTPATIENT PHYSICAL THERAPY THORACOLUMBAR EVALUATION   Patient Name: Roberta Hayes MRN: [id number] DOB:September 29, 1955, 68 y.o., female Today's Date: 08/29/2023  END OF SESSION:  PT End of Session - 08/29/23 1148     Visit Number 1    Number of Visits 13    Date for PT Re-Evaluation 10/24/23    Authorization Type BCBS medicare    Progress Note Due on Visit 10    PT Start Time 1150   late check in   PT Stop Time 1229    PT Time Calculation (min) 39 min    Activity Tolerance Patient tolerated treatment well             Past Medical History:  Diagnosis Date   Diabetes mellitus without complication (HCC)    Thyroid  disease    Past Surgical History:  Procedure Laterality Date   ABDOMINAL HYSTERECTOMY     CHOLECYSTECTOMY     KNEE ARTHROSCOPY Left    TOTAL ABDOMINAL HYSTERECTOMY     Patient Active Problem List   Diagnosis Date Noted   Well adult exam 10/28/2021   Left lumbar radiculitis 10/07/2021   Encounter for weight management 03/15/2021   HLD (hyperlipidemia) 09/27/2019   Type 2 diabetes mellitus without complication, without long-term current use of insulin (HCC) 09/26/2019   Acquired hypothyroidism 09/26/2019   Leg cramping 09/26/2019   Vitamin D  deficiency 09/26/2019   Primary osteoarthritis of left knee 06/29/2016   H/O hysterectomy for benign disease 09/04/2015   Psoriasis 06/15/2014   Nonspecific elevation of levels of transaminase or lactic acid dehydrogenase (LDH) 06/15/2014   Low grade squamous intraepithelial lesion (LGSIL) 06/15/2014   History of loop electrical excision procedure (LEEP) 06/15/2014   History of cervical dysplasia 06/15/2014   Attention deficit disorder with hyperactivity 06/15/2014    PCP: Alvia Bring, DO  REFERRING PROVIDER: Curtis Debby PARAS, MD  REFERRING DIAG: M54.50 (ICD-10-CM) - Acute right-sided low back pain without sciatica  Rationale for Evaluation and Treatment: Rehabilitation  THERAPY DIAG:  Other low back  pain  Muscle weakness (generalized)  ONSET DATE: Mid December  SUBJECTIVE:                                                                                                                                                                                           SUBJECTIVE STATEMENT: L sided low back pain which began after a bout of bowling. No pain during, but did begin to develop some pain the next day. Slowly worsened and began to spread to distal LLE. Starts at back and moves posteriorly to knee, then goes anteriorly at shin. Describes herself as quite active with  almost daily pilates, tends to improve her pain. Most difficulty with prolonged positioning/activity. States she is on day five of prednisone  which she states has been quite helpful.  No BIL LE, no bowel/bladder changes, no saddle anesthesia, no buckling.   PERTINENT HISTORY:  DM2  PAIN:  Are you having pain: mild discomfort, no overt pain Location/description: L low back, down distal LE  Best-worst over past week: 0-10/10  - aggravating factors: prolonged sitting with feet flat, standing still, prolonged walking or walking when symptoms already irritated - Easing factors: prednisone , pain medication, heating pad for back, ice for shin, hip flexion stretching, pilates  PRECAUTIONS: None  WEIGHT BEARING RESTRICTIONS: No  FALLS:  Has patient fallen in last 6 months? No  LIVING ENVIRONMENT: 2 story house, main level livable. No STE Lives w/ husband  OCCUPATION: works for [employer], designer, industrial/product . Plans to retire soon   PLOF: Independent - very active, enjoys pilates, travelling  PATIENT GOALS: less pain, not have to use medication  NEXT MD VISIT: February 2025  OBJECTIVE:  Note: Objective measures were completed at Evaluation unless otherwise noted.  DIAGNOSTIC FINDINGS:  Lumbar XR 08/25/23, no read yet in EPIC at time of eval  PATIENT SURVEYS:  FOTO deferred on eval given time  constraints  COGNITION: Overall cognitive status: Within functional limits for tasks assessed     SENSATION/NEURO: Light touch intact BIL LE No clonus either LE Negative hoffmann and tromner sign BIL No ataxia with gait   POSTURE: tendency towards fwd flexed posture and increased kyphosis, pt states she tries to be mindful of posture but neutral feels uncomfortable  PALPATION: deferred  LUMBAR ROM:   AROM eval  Flexion Able to touch floor without pain or difficulty   Extension 50% * and LE pain   Right lateral flexion   Left lateral flexion   Right rotation 100%  Left rotation 90%    (Blank rows = not tested) (Key: WFL = within functional limits not formally assessed, * = concordant pain, s = stiffness/stretching sensation, NT = not tested) Comment: no change with repeated extension  LOWER EXTREMITY ROM:     Active  Right eval Left eval  Hip flexion    Hip extension    Hip internal rotation    Hip external rotation    Knee extension    Knee flexion    (Blank rows = not tested) (Key: WFL = within functional limits not formally assessed, * = concordant pain, s = stiffness/stretching sensation, NT = not tested)  Comments:    LOWER EXTREMITY MMT:    MMT Right eval Left eval  Hip flexion 5 4+ strain  Hip abduction (modified sitting) 5 5  Hip internal rotation 4+ 4- *  Hip external rotation 4+ 4+  Knee flexion 5 5  Knee extension 5 5  Ankle dorsiflexion 5 4+   (Blank rows = not tested) (Key: WFL = within functional limits not formally assessed, * = concordant pain, s = stiffness/stretching sensation, NT = not tested)  Comments:    LUMBAR SPECIAL TESTS:  Negative slump LLE  FUNCTIONAL TESTS:  5xSTS: 9.09 sec gentle UE support from thighs, no pain; noted tendency towards increased posterior weight shift and minimal trunk movement  GAIT: Distance walked: within clinic Assistive device utilized: None Level of assistance: Complete  Independence Comments: gait mechanics grossly WNL  TREATMENT DATE:  [facility] Adult PT Treatment:  DATE: 08/29/23 Therapeutic Exercise: STS x8 cues for trunk mechanics Cat/camel x10 cues for setup and comfortable ROM, visual demonstration Hooklying hip flexion march x10 cues for core contraction, breath control HEP handout + education, relevant anatomy/physiology, rationale for interventions                                                                                                                           PATIENT EDUCATION:  Education details: Pt education on PT impairments, prognosis, and POC. Informed consent. Rationale for interventions, safe/appropriate HEP performance Person educated: Patient Education method: Explanation, Demonstration, Tactile cues, Verbal cues, and Handouts Education comprehension: verbalized understanding, returned demonstration, verbal cues required, tactile cues required, and needs further education    HOME EXERCISE PROGRAM: Access Code: [email or web address] URL: [email or web address] Date: 08/29/2023 Prepared by: Alm Jenny  Exercises - Sit to Stand Without Arm Support  - 2-3 x daily - 1 sets - 8 reps - Supine March  - 2-3 x daily - 1 sets - 10 reps - Cat Cow  - 2-3 x daily - 1 sets - 10 reps  ASSESSMENT:  CLINICAL IMPRESSION: Patient is a pleasant 68 y.o. woman who was seen today for physical therapy evaluation and treatment for low back pain + LLE pain beginning after a bout of bowling in Mid December. She describes herself as quite active with pilates, has difficulty with prolonged positioning/activities. On exam today she demonstrates reduced lumbar mobility (extension and L rotation), reduced L hip strength, and altered mechanics w/ STS. Tolerates exam/HEP well overall, no adverse events or increase in resting pain. Recommend trial of skilled PT to address aforementioned deficits with aim of  improving functional tolerance and reducing pain with typical activities. Pt departs today's session in no acute distress, all voiced concerns/questions addressed appropriately from PT perspective.    OBJECTIVE IMPAIRMENTS: decreased activity tolerance, decreased endurance, decreased mobility, decreased ROM, decreased strength, improper body mechanics, postural dysfunction, and pain.   ACTIVITY LIMITATIONS: carrying, lifting, sitting, standing, and locomotion level  PARTICIPATION LIMITATIONS: community activity  PERSONAL FACTORS: Age, Time since onset of injury/illness/exacerbation, and 1 comorbidity: DM2  are also affecting patient's functional outcome.   REHAB POTENTIAL: Good  CLINICAL DECISION MAKING: Stable/uncomplicated  EVALUATION COMPLEXITY: Low   GOALS: Goals reviewed with patient? Yes  SHORT TERM GOALS: Target date: 09/26/2023 Pt will demonstrate appropriate understanding and performance of initially prescribed HEP in order to facilitate improved independence with management of symptoms.  Baseline: HEP provided on eval Goal status: INITIAL   2. Pt will report at least 50% decrease in overall pain levels in past week in order to facilitate improved tolerance to basic ADLs/mobility.   Baseline: 0-10/10  Goal status: INITIAL    LONG TERM GOALS: Target date: 10/24/2023 Pt will meet predicted score on FOTO in order to demonstrate improved perception of functional status due to symptoms.  Baseline: FOTO TBD Goal status: INITIAL  2.  Pt will demonstrate full lumbar extension AROM without pain in order  to demonstrate improved tolerance to functional movement patterns.  Baseline: see ROM chart above Goal status: INITIAL  3.  Pt will demonstrate symmetrical global hip MMT in order to demonstrate improved strength for functional movements.  Baseline: see MMT chart above Goal status: INITIAL  4. Pt will be able to perform floor transfer w/o UE support in order to facilitate  improved functional independence/mobility.  Baseline: deferred on eval given time constraints, pt reports needing to use UE  Goal status: INITIAL   5. Pt will report at least 50% decrease in overall pain levels in past week in order to facilitate improved tolerance to basic ADLs/mobility.   Baseline: 0-10/10  Goal status: INITIAL    PLAN:  PT FREQUENCY: 2x/week  PT DURATION: 8 weeks  PLANNED INTERVENTIONS: 97164- PT Re-evaluation, 97110-Therapeutic exercises, 97530- Therapeutic activity, 97112- Neuromuscular re-education, 97535- Self Care, 02859- Manual therapy, [phone]- Gait training, [phone]- Aquatic Therapy, [phone]- Electrical stimulation (unattended), Patient/Family education, Balance training, Stair training, Taping, Dry Needling, Joint mobilization, Spinal mobilization, Cryotherapy, and Moist heat.  PLAN FOR NEXT SESSION: Review/update HEP PRN. Work on [employer] exercises as appropriate with emphasis on gradually improving extension tolerance, core/hip strength. Symptom modification strategies as indicated/appropriate.    Alm DELENA Jenny PT, DPT 08/29/2023 12:44 PM

## 2023-08-29 ENCOUNTER — Encounter: Payer: Self-pay | Admitting: Physical Therapy

## 2023-08-29 ENCOUNTER — Ambulatory Visit: Payer: Medicare Other | Attending: Sports Medicine | Admitting: Physical Therapy

## 2023-08-29 DIAGNOSIS — M6281 Muscle weakness (generalized): Secondary | ICD-10-CM | POA: Diagnosis present

## 2023-08-29 DIAGNOSIS — M5459 Other low back pain: Secondary | ICD-10-CM | POA: Insufficient documentation

## 2023-08-29 DIAGNOSIS — M545 Low back pain, unspecified: Secondary | ICD-10-CM | POA: Diagnosis not present

## 2023-09-29 ENCOUNTER — Ambulatory Visit: Payer: Medicare Other | Admitting: Sports Medicine

## 2023-12-11 ENCOUNTER — Other Ambulatory Visit: Payer: Self-pay | Admitting: Sports Medicine

## 2023-12-11 DIAGNOSIS — M545 Low back pain, unspecified: Secondary | ICD-10-CM

## 2024-01-03 DIAGNOSIS — H35371 Puckering of macula, right eye: Secondary | ICD-10-CM | POA: Diagnosis not present

## 2024-02-15 ENCOUNTER — Encounter: Payer: Self-pay | Admitting: Family Medicine

## 2024-02-15 ENCOUNTER — Other Ambulatory Visit: Payer: Self-pay | Admitting: Family Medicine

## 2024-02-15 ENCOUNTER — Telehealth: Payer: Self-pay

## 2024-02-15 ENCOUNTER — Ambulatory Visit: Admitting: Family Medicine

## 2024-02-15 ENCOUNTER — Ambulatory Visit

## 2024-02-15 VITALS — BP 117/71 | HR 86 | Ht 65.0 in | Wt 136.0 lb

## 2024-02-15 DIAGNOSIS — M545 Low back pain, unspecified: Secondary | ICD-10-CM

## 2024-02-15 DIAGNOSIS — R051 Acute cough: Secondary | ICD-10-CM

## 2024-02-15 DIAGNOSIS — R059 Cough, unspecified: Secondary | ICD-10-CM | POA: Diagnosis not present

## 2024-02-15 MED ORDER — AZITHROMYCIN 250 MG PO TABS: ORAL_TABLET | ORAL | 0 refills | Status: DC

## 2024-02-15 MED ORDER — AZITHROMYCIN 250 MG PO TABS: ORAL_TABLET | ORAL | 0 refills | Status: AC

## 2024-02-15 MED ORDER — PREDNISONE 50 MG PO TABS: ORAL_TABLET | ORAL | 0 refills | Status: DC

## 2024-02-15 MED ORDER — HYDROCODONE BIT-HOMATROP MBR 5-1.5 MG/5ML PO SOLN
5.0000 mL | Freq: Three times a day (TID) | ORAL | 0 refills | Status: DC | PRN
Start: 2024-02-15 — End: 2024-07-13

## 2024-02-15 NOTE — Addendum Note (Signed)
 Addended by: Olanna Percifield E on: 02/15/2024 04:12 PM   Modules accepted: Orders

## 2024-02-15 NOTE — Patient Instructions (Signed)
 Have xray completed.  Start Azithromycin and prednisone .

## 2024-02-15 NOTE — Telephone Encounter (Signed)
 Copied from CRM [id number]. Topic: Clinical - Medication Question >> Feb 15, 2024  2:57 PM Brittney F wrote: Reason for CRM:   Current Medication: amoxicillin 250 mg   Patient was instructed during her visit today with PCP to continue taking the amoxicillin she had at home. Upon arriving at home she realized she only has one pill left. Patient is requesting a new prescription be sent over to the preferred pharmacy because she leaves tomorrow 02/16/2024.   Preferred Pharmacy: [id number] - [place] - [address] [place] [address] Phone: [phone] Fax: [phone] Hours: Not open 24 hours   Callback Number: [id number]

## 2024-02-15 NOTE — Progress Notes (Signed)
 Roberta Hayes - 68 y.o. female MRN [id number]  Date of birth: 05-Nov-1955  Subjective Chief Complaint  Patient presents with   URI    HPI Roberta Hayes is a 68 y.o. female here today with complaint of cough and congestion.  She was on a cruise recently when she started to have symptoms. Started having symptoms about 10 days ago.  Cough is productive of thin mucus.  Denies fever, chills, shortness of breath, or breathing difficulty.  She has tried nyquil and started leftover amoxicillin that she had at home.  She has not had much improvement with these.    ROS:  A comprehensive ROS was completed and negative except as noted per HPI  No Known Allergies  Past Medical History:  Diagnosis Date   Diabetes mellitus without complication (HCC)    Thyroid  disease     Past Surgical History:  Procedure Laterality Date   ABDOMINAL HYSTERECTOMY     CHOLECYSTECTOMY     KNEE ARTHROSCOPY Left    TOTAL ABDOMINAL HYSTERECTOMY      Social History   Socioeconomic History   Marital status: Married    Spouse name: Not on file   Number of children: Not on file   Years of education: Not on file   Highest education level: Not on file  Occupational History   Not on file  Tobacco Use   Smoking status: Never   Smokeless tobacco: Never  Substance and Sexual Activity   Alcohol use: Yes    Comment: rarely   Drug use: Never   Sexual activity: Not on file  Other Topics Concern   Not on file  Social History Narrative   Not on file   Social Drivers of Health   Financial Resource Strain: Not on file  Food Insecurity: No Food Insecurity (09/21/2021)   Received from [facility]   Hunger Vital Sign    Within the past 12 months, you worried that your food would run out before you got the money to buy more.: Never true    Within the past 12 months, the food you bought just didn't last and you didn't have money to get more.: Never true  Transportation Needs: Not on file  Physical Activity: Not on  file  Stress: Not on file  Social Connections: Unknown (01/03/2022)   [facility] from [facility]   Social Network    Social Network: Not on file    History reviewed. No pertinent family history.  Health Maintenance  Topic Date Due   Medicare Annual Wellness (AWV)  Never done   HIV Screening  Never done   Pneumococcal Vaccine: 50+ Years (2 of 2 - PCV) 11/11/2021   OPHTHALMOLOGY EXAM  04/01/2022   COVID-19 Vaccine (4 - 2024-25 season) 04/24/2023   DEXA SCAN  Never done   MAMMOGRAM  10/12/2023   FOOT EXAM  10/14/2023   HEMOGLOBIN A1C  01/15/2024   INFLUENZA VACCINE  03/23/2024   Diabetic kidney evaluation - eGFR measurement  08/04/2024   Diabetic kidney evaluation - Urine ACR  08/04/2024   Colonoscopy  04/18/2025   DTaP/Tdap/Td (2 - Td or Tdap) 03/14/2031   Hepatitis C Screening  Completed   Zoster Vaccines- Shingrix  Completed   Hepatitis B Vaccines  Aged Out   HPV VACCINES  Aged Out   Meningococcal B Vaccine  Aged Out     ----------------------------------------------------------------------------------------------------------------------------------------------------------------------------------------------------------------- Physical Exam BP 117/71 (BP Location: Left Arm, Patient Position: Sitting)   Pulse 86   Ht 5' 5 (1.651  m)   Wt 136 lb (61.7 kg)   SpO2 98%   BMI 22.63 kg/m   Physical Exam Constitutional:      Appearance: Normal appearance.  HENT:     Head: Normocephalic and atraumatic.   Eyes:     General: No scleral icterus.   Cardiovascular:     Rate and Rhythm: Normal rate and regular rhythm.  Pulmonary:     Effort: Pulmonary effort is normal.     Breath sounds: Rales present.     Comments: Diminished breath sounds LLL  Musculoskeletal:     Cervical back: Neck supple.   Neurological:     Mental Status: She is alert.   Psychiatric:        Mood and Affect: Mood normal.        Behavior: Behavior normal.      ------------------------------------------------------------------------------------------------------------------------------------------------------------------------------------------------------------------- Assessment and Plan  Acute cough Concern for pneumonia based on exam.  Start azithromycin and prednisone .  CXR ordered.  Hycodan as needed for cough.    Meds ordered this encounter  Medications   azithromycin (ZITHROMAX) 250 MG tablet    Sig: Take 2 tablets on day 1, then 1 tablet daily on days 2 through 5    Dispense:  6 tablet    Refill:  0   predniSONE  (DELTASONE ) 50 MG tablet    Sig: One tab PO daily for 5 days.    Dispense:  5 tablet    Refill:  0   HYDROcodone bit-homatropine (HYCODAN) 5-1.5 MG/5ML syrup    Sig: Take 5 mLs by mouth every 8 (eight) hours as needed for cough.    Dispense:  120 mL    Refill:  0    No follow-ups on file.

## 2024-02-15 NOTE — Telephone Encounter (Signed)
 Copied from CRM [id number]. Topic: Clinical - Prescription Issue >> Feb 15, 2024  3:16 PM Mercer PEDLAR wrote: Reason for CRM: Patient's husband calling while at pharmacy stating that they do not have prescriptions which were sent today 02/15/28 and pharmacist stated they would like them resent.   [id number] - [place], [place] - [address] [place] [address] Phone: [phone] Fax: [phone]

## 2024-02-15 NOTE — Telephone Encounter (Signed)
 The prescriptions state that the prescriptions have been sent but not received. They may need to be resent.

## 2024-02-15 NOTE — Telephone Encounter (Signed)
 Patient informed.

## 2024-02-15 NOTE — Assessment & Plan Note (Signed)
 Concern for pneumonia based on exam.  Start azithromycin and prednisone .  CXR ordered.  Hycodan as needed for cough.

## 2024-03-13 ENCOUNTER — Telehealth: Payer: Self-pay

## 2024-03-13 NOTE — Telephone Encounter (Signed)
 PCP response has been faxed to [facility].

## 2024-03-13 NOTE — Telephone Encounter (Signed)
 Copied from CRM [id number]. Topic: General - Other >> Mar 12, 2024  3:50 PM Susanna ORN wrote: Reason for CRM: Jereld, with [employer] [place], called to verify fax number and also to check if a SUPD form was received. Provided her with the clinic's fax number and advised her that the SUPD was received as I checked the media tab in chart. She's wanting to know what is the turnaround time for completion?

## 2024-03-20 ENCOUNTER — Ambulatory Visit: Payer: Self-pay

## 2024-03-20 NOTE — Telephone Encounter (Signed)
 Pharmacy pended

## 2024-03-20 NOTE — Telephone Encounter (Signed)
 Patient was in [place] and experiencing blistering lips and painful

## 2024-03-20 NOTE — Telephone Encounter (Signed)
 FYI Only or Action Required?: Action required by provider: medication refill request.  Patient was last seen in primary care on 02/15/2024 by Alvia Bring, DO.  Called Nurse Triage reporting Blister.  Symptoms began yesterday.  Interventions attempted: Nothing.  Symptoms are: unchanged.  Triage Disposition: Discuss With PCP and Callback by Nurse Today (overriding Home Care)  Patient/caregiver understands and will follow disposition?:   Reason for Disposition  Cold sore without complications  Answer Assessment - Initial Assessment Questions Pt requesting PO and topical antiviral for sun blisters on her lips. Is on vacation. Advised that antivirals for oral blisters are typically used to treat viral infections such as cold sores. Pt states a PA at her work used to rx her these when she had sunburn blisters. Advised the request would be passed to her PCP office and someone will reach out if needed.  [id number] - [place], [place] - [address] [[id number]]  1. APPEARANCE of SORES: What do the sores look like?     Blistering on lips  Answer Assessment - Initial Assessment Questions Pt requesting PO and topical antiviral for sun blisters on her lips. Is on vacation. Advised that antivirals for oral blisters are typically used to treat viral infections such as cold sores. Pt states a PA at her work used to rx her these when she had sunburn blisters. Advised the request would be passed to her PCP office and someone will reach out if needed.  [facility] - [place], [place] - [address] [[id number]]  1. APPEARANCE of SORES: What do the sores look like?     Blistering on lips  Protocols used: Sores-A-AH, Cold Sores (Fever Blisters)-A-AH

## 2024-03-21 ENCOUNTER — Telehealth: Payer: Self-pay | Admitting: Family Medicine

## 2024-03-21 DIAGNOSIS — B001 Herpesviral vesicular dermatitis: Secondary | ICD-10-CM

## 2024-03-21 MED ORDER — VALACYCLOVIR HCL 1 G PO TABS: 2000.0000 mg | ORAL_TABLET | Freq: Two times a day (BID) | ORAL | 0 refills | Status: AC

## 2024-03-21 MED ORDER — ACYCLOVIR 5 % EX OINT
1.0000 | TOPICAL_OINTMENT | CUTANEOUS | 1 refills | Status: AC | PRN
Start: 2024-03-21 — End: ?

## 2024-03-21 NOTE — Telephone Encounter (Signed)
 Prescriptions sent to the pharmacy as requested.   ___________________________________________ Zada FREDRIK Palin, DNP, APRN, FNP-BC Primary Care and Sports Medicine [facility]

## 2024-03-21 NOTE — Telephone Encounter (Signed)
 Pt is calling to get refill on the following Rx:  Acyclovir  ointment and Valtrex . Please send prescriptions to   [id number] - [place], [place] - [address] Phone: [phone]  Fax: [phone]     CB:[phone]

## 2024-03-22 NOTE — Telephone Encounter (Signed)
 03/22/2024-Left message on patients voicemail that  Rx refills have been sent to her requested pharmacy.

## 2024-03-22 NOTE — Telephone Encounter (Signed)
 Patient advised.

## 2024-03-23 ENCOUNTER — Other Ambulatory Visit (HOSPITAL_COMMUNITY): Payer: Self-pay

## 2024-03-23 ENCOUNTER — Telehealth: Payer: Self-pay

## 2024-03-23 NOTE — Telephone Encounter (Signed)
 Forwarding message to [facility] covering Dr. Alvia.  Should I sent to PA team to attempt an appeal?

## 2024-03-23 NOTE — Telephone Encounter (Signed)
 Added diagnosis of fever blister to the medication. Can we resubmit to insurance with corrected info please?  ___________________________________________ Zada FREDRIK Palin, DNP, APRN, FNP-BC Primary Care and Sports Medicine [facility]

## 2024-03-23 NOTE — Telephone Encounter (Signed)
 Pharmacy Patient Advocate Encounter   Received notification from CoverMyMeds that prior authorization for Acyclovir  5% ointment is required/requested.   Insurance verification completed.   The patient is insured through [employer] .   Per test claim: PA required; PA started via CoverMyMeds. KEY [id number] . Waiting for clinical questions to populate.

## 2024-03-23 NOTE — Telephone Encounter (Signed)
 Copied from CRM [id number]. Topic: Clinical - Prescription Issue >> Mar 23, 2024  1:03 PM Kevelyn M wrote: Reason for CRM: Medication, Acyclovir  5% ointment, has been declined because of the diagnosis, it not being prescribed with FDA level of use. Can appeal this.  Call back; [phone] opt 5

## 2024-03-23 NOTE — Telephone Encounter (Signed)
 Clinical questions answered and PA submitted.

## 2024-03-23 NOTE — Telephone Encounter (Signed)
 Pharmacy Patient Advocate Encounter  Received notification from [employer] that Prior Authorization for Acyclovir  5% ointment has been DENIED.  See denial reason below. No denial letter attached in CMM. Will attach denial letter to Media tab once received.   PA #/Case ID/Reference #: [id number]

## 2024-04-26 ENCOUNTER — Encounter: Payer: Self-pay | Admitting: Sports Medicine

## 2024-06-27 ENCOUNTER — Telehealth: Payer: Self-pay

## 2024-06-27 NOTE — Telephone Encounter (Signed)
 Copied from CRM [id number]. Topic: General - Other >> Jun 27, 2024  9:32 AM Myrick T wrote: Reason for CRM: Mercedes L from [facility] called to verify that form for a Statin was recvd. Roberta Hayes is requesting the form be completed and sent back

## 2024-06-28 NOTE — Telephone Encounter (Signed)
 Form was in media tab so printed and placed in your basket for completion

## 2024-07-10 ENCOUNTER — Telehealth: Payer: Self-pay | Admitting: Pharmacist

## 2024-07-10 NOTE — Progress Notes (Signed)
 Pharmacy Quality Measure Review  This patient is appearing on the insurance-providing list for being at risk of failing the adherence measure for Statin Use in Persons with Diabetes (SUPD) medications this calendar year.   Patient notes she is prescribed pitavastatin , but has been unable to afford for over a year. Reports she had myopathy with other statins (per chart review, this included rosuvastatin and atorvastatin.   Will route to embedded pharmacist to work with Dr. Alvia on a refill and navigate medication access, including [employer] grant.   Catie [facility], PharmD, [facility] Clinical Pharmacist [phone]

## 2024-07-11 ENCOUNTER — Telehealth: Payer: Self-pay | Admitting: Family Medicine

## 2024-07-11 NOTE — Telephone Encounter (Unsigned)
 Copied from CRM #[id number]. Topic: General - Other >> Jul 11, 2024  3:13 PM Roberta Hayes wrote: Reason for CRM: patient called in requesting increase of dosage of mounjaro  from the 12.5

## 2024-07-12 MED ORDER — TIRZEPATIDE 15 MG/0.5ML ~~LOC~~ SOAJ: 15.0000 mg | SUBCUTANEOUS | 1 refills | Status: DC

## 2024-07-12 NOTE — Telephone Encounter (Signed)
 Pt requesting Mounjaro  increase to 15mg 

## 2024-07-13 ENCOUNTER — Other Ambulatory Visit: Payer: Self-pay

## 2024-07-13 DIAGNOSIS — E785 Hyperlipidemia, unspecified: Secondary | ICD-10-CM

## 2024-07-13 MED ORDER — TIRZEPATIDE 15 MG/0.5ML ~~LOC~~ SOAJ: 15.0000 mg | SUBCUTANEOUS | 1 refills | Status: AC

## 2024-07-13 MED ORDER — PITAVASTATIN CALCIUM 2 MG PO TABS: ORAL_TABLET | ORAL | 1 refills | Status: AC

## 2024-07-13 NOTE — Progress Notes (Signed)
 07/13/2024 Name: Roberta Hayes MRN: [id number] DOB: [birth year]  Chief Complaint  Patient presents with   Medication Assistance   Roberta Hayes is a 68 y.o. year old female who presented for a telephone visit.   They were referred to the pharmacist by a quality report for assistance in managing hyperlipidemia/cardiovascular risk reduction.   Subjective:  Care Team: Primary Care Provider: Alvia Bring, DO   Medication Access/Adherence  Current Pharmacy:  [facility] [id number] - [place], [place] - [address] [place]  [address] Phone: [phone] Fax: [phone]  Patient reports affordability concerns with their medications: Yes  Patient reports access/transportation concerns to their pharmacy: No  Patient reports adherence concerns with their medications:  Yes    Diabetes: Current medications: Mounjaro  15mg  weekly -Mounjaro  recently increased from 12.5mg  weekly to 15mg  weekly, but [facility] was [facility] [place]   Macrovascular and Microvascular Risk Reduction:  Statin? Pitavastatin  2mg  but patient has not taken in quite sometime due to cost; ACEi/ARB? no; therapy not indicated  Last urinary albumin/creatinine ratio:  Lab Results  Component Value Date   MICRALBCREAT 8 08/05/2023   MICRALBCREAT 14 06/16/2022   MICRALBCREAT 5 10/28/2021   MICRALBCREAT 2 11/11/2020   MICRALBCREAT NOTE 09/24/2019   Last eye exam:  Lab Results  Component Value Date   HMDIABEYEEXA No Retinopathy 04/01/2021   Last foot exam: 10/13/2022 Tobacco Use:  Tobacco Use: Low Risk  (02/15/2024)   Patient History    Smoking Tobacco Use: Never    Smokeless Tobacco Use: Never    Passive Exposure: Not on file     Objective:  Lab Results  Component Value Date   HGBA1C 5.2 07/18/2023    Lab Results  Component Value Date   CREATININE 0.85 08/05/2023   BUN 17 08/05/2023   NA 140 08/05/2023   K 4.5 08/05/2023   CL 105 08/05/2023   CO2  23 08/05/2023    Lab Results  Component Value Date   CHOL 195 08/05/2023   HDL 63 08/05/2023   LDLCALC 116 (H) 08/05/2023   TRIG 88 08/05/2023   CHOLHDL 4.2 06/16/2022    Medications Reviewed Today     Reviewed by Deanna Channing LABOR, RPH (Pharmacist) on 07/13/24 at 0944  Med List Status: <None>   Medication Order Taking? Sig Documenting Provider Last Dose Status Informant  acyclovir  ointment (ZOVIRAX ) 5 % [id number]  Apply 1 Application topically as needed. [name], NP  Active   augmented betamethasone  dipropionate (DIPROLENE -AF) 0.05 % cream [id number]  Apply topically 2 (two) times daily. Alvia Bring, DO  Active   Betamethasone  Valerate 0.12 % foam [id number]  Apply 2 Pump topically 2 (two) times daily as needed. Alvia Bring, DO  Active   Blood Glucose Monitoring Suppl KIT [id number]  Please dispense meter/strips/lancets per insurance preference to check glucose daily. Lancets #100, Strips: #100 Alvia Bring, DO  Active   fluocinonide  (LIDEX ) 0.05 % external solution [id number]  Apply 1 application topically 2 (two) times daily. Alvia Bring, DO  Active   glucose blood test strip [id number]  Use as instructed Alvia Bring, DO  Active   HYDROcodone  bit-homatropine (HYCODAN) 5-1.5 MG/5ML syrup [id number]  Take 5 mLs by mouth every 8 (eight) hours as needed for cough. Alvia Bring, DO  Active   levothyroxine  (SYNTHROID ) 88 MCG tablet [id number]  Take 1 tablet (88 mcg total) by mouth daily before breakfast. Alvia Bring, DO  Active   LORazepam (ATIVAN)  2 MG tablet [id number]  Take 2 mg by mouth every 8 (eight) hours as needed. [provider]  Active   meloxicam  (MOBIC ) 15 MG tablet [id number]  One tab PO every 24 hours with a meal for 2 weeks, then once every 24 hours prn pain. Curtis Debby PARAS, MD  Active   Pitavastatin  Calcium  (LIVALO ) 2 MG TABS [id number]  TAKE 1 TABLET(2 MG) BY MOUTH DAILY Alvia Bring, DO  Active   predniSONE  (DELTASONE ) 50 MG tablet [id number]   One tab PO daily for 5 days. Alvia Bring, DO  Active   tirzepatide  (MOUNJARO ) 15 MG/0.5ML Pen [id number]  Inject 15 mg into the skin once a week. Alvia Bring, DO  Active   traMADol  (ULTRAM ) 50 MG tablet [id number]  Take 1 tablet (50 mg total) by mouth 3 (three) times daily as needed. Curtis Debby PARAS, MD  Active   Turmeric (QC TUMERIC COMPLEX PO) [id number]  Take 550 mg by mouth. Tumeric and ginger [provider]  Active   valACYclovir  (VALTREX ) 1000 MG tablet [id number]  Take 1,000 mg by mouth 2 (two) times daily. [provider]  Active   XIGDUO  XR 12-998 MG TB24 [id number]  TAKE 1 TABLET BY MOUTH DAILY Alvia Bring, DO  Active            Assessment/Plan:   Diabetes: -Currently controlled; goal A1c <7%. Cardiorenal risk reduction is optimized.. Blood pressure is at goal <130/80. LDL is not at goal.  -Increase Mounjaro  to 15mg  weekly- order pending to go to [facility] (patient's preferred [facility]), and [facility] [place] [facility] yesterday -Resume pitavastatin  2mg  daily (test claim reflects $0 copay, and patient in agreement as she tolerated this well in the past versus other statin therapies).  Order pending for PCP to sign if in agreement. -Patient is due for a follow-up and lab work, but I would recommend doing this in another 10-12 weeks since she will just now be resuming statin therapy.  A1c, UACR, CMP, and Lipid panel recommended end of January or beginning of February  Channing DELENA Mealing, PharmD, DPLA

## 2024-07-23 ENCOUNTER — Other Ambulatory Visit: Payer: Self-pay | Admitting: Family Medicine

## 2024-08-15 ENCOUNTER — Other Ambulatory Visit: Payer: Self-pay | Admitting: Family Medicine

## 2024-08-15 DIAGNOSIS — E039 Hypothyroidism, unspecified: Secondary | ICD-10-CM

## 2024-08-20 ENCOUNTER — Telehealth: Payer: Self-pay

## 2024-08-20 ENCOUNTER — Other Ambulatory Visit (HOSPITAL_COMMUNITY): Payer: Self-pay

## 2024-08-20 NOTE — Telephone Encounter (Signed)
 Pharmacy Patient Advocate Encounter   Received notification from CoverMyMeds that prior authorization for Mounjaro  15mg /0.26ml is due for renewal.   Insurance verification completed.   The patient is insured through [facility].  Action: Medication is now available without a prior authorization.

## 2024-08-30 ENCOUNTER — Telehealth: Payer: Self-pay | Admitting: Family Medicine

## 2024-08-30 NOTE — Telephone Encounter (Unsigned)
 Copied from CRM [id number]. Topic: Clinical - Medication Question >> Aug 30, 2024  1:40 PM Yolanda T wrote: Reason for CRM: patient called to see if she can get a zpak or antibiotic to have as a preventative on her cruise Wednesday 01/14. Please f/u with patient

## 2024-08-31 NOTE — Telephone Encounter (Signed)
 Patient is aware of recommendations also stated that last time she went on the cruise she got sick and when she returned she came to see and that you ended up prescribing the zpak anyway. She also stated she can buy it online but doesn't understand why her Dr. Judene just call it in when the same thing happened last time. Please advise

## 2024-09-04 ENCOUNTER — Telehealth: Payer: Self-pay

## 2024-09-04 MED ORDER — OSELTAMIVIR PHOSPHATE 75 MG PO CAPS: 75.0000 mg | ORAL_CAPSULE | Freq: Every day | ORAL | 0 refills | Status: AC

## 2024-09-04 MED ORDER — OSELTAMIVIR PHOSPHATE 75 MG PO CAPS: 75.0000 mg | ORAL_CAPSULE | Freq: Two times a day (BID) | ORAL | 0 refills | Status: DC

## 2024-09-04 NOTE — Telephone Encounter (Signed)
 Spoke with patient - She is leaving for her cruise tonight -  Requesting that Dr. Alvia please approve taking an abx with her on the cruise  She states she goes on cruises a lot and if she has to see the on ship doctor the cost is over $500.  She would like to have this with her in the event it is needed.   I did explain that usually abx are not given unless we know what we are treating due to risk of bacteria becoming resistant to them.   She would like the Tamiflu  preventative prescription sent to pharmacy.

## 2024-09-04 NOTE — Telephone Encounter (Signed)
 Patient informed.
